# Patient Record
Sex: Female | Born: 1980 | Race: White | Hispanic: No | Marital: Single | Smoking: Former smoker
Health system: Southern US, Community
[De-identification: ages and names within clinical notes are randomized; demographics above are authoritative.]

## PROBLEM LIST (undated history)

## (undated) DIAGNOSIS — O139 Gestational [pregnancy-induced] hypertension without significant proteinuria, unspecified trimester: Secondary | ICD-10-CM

## (undated) DIAGNOSIS — E669 Obesity, unspecified: Secondary | ICD-10-CM

## (undated) DIAGNOSIS — O24419 Gestational diabetes mellitus in pregnancy, unspecified control: Secondary | ICD-10-CM

## (undated) HISTORY — DX: Gestational (pregnancy-induced) hypertension without significant proteinuria, unspecified trimester: O13.9

## (undated) HISTORY — PX: FOOT SURGERY: SHX648

## (undated) HISTORY — PX: TUBAL LIGATION: SHX77

## (undated) HISTORY — DX: Obesity, unspecified: E66.9

---

## 2011-01-07 ENCOUNTER — Ambulatory Visit: Payer: Self-pay | Admitting: Family Medicine

## 2011-06-22 ENCOUNTER — Emergency Department: Payer: Self-pay | Admitting: Emergency Medicine

## 2011-08-17 DIAGNOSIS — O24419 Gestational diabetes mellitus in pregnancy, unspecified control: Secondary | ICD-10-CM

## 2011-08-17 HISTORY — DX: Gestational diabetes mellitus in pregnancy, unspecified control: O24.419

## 2012-03-07 ENCOUNTER — Emergency Department: Payer: Self-pay | Admitting: Emergency Medicine

## 2012-03-07 LAB — COMPREHENSIVE METABOLIC PANEL
BUN: 9 mg/dL (ref 7–18)
Chloride: 105 mmol/L (ref 98–107)
Co2: 22 mmol/L (ref 21–32)
EGFR (African American): >60
EGFR (Non-African Amer.): >60
SGOT(AST): 16 U/L (ref 15–37)
SGPT (ALT): 27 U/L

## 2012-03-07 LAB — CBC
HCT: 35.7 % (ref 35.0–47.0)
HGB: 11.6 g/dL — ABNORMAL LOW (ref 12.0–16.0)
MCHC: 32.5 g/dL (ref 32.0–36.0)
MCV: 92 fL (ref 80–100)
RBC: 3.86 10*6/uL (ref 3.80–5.20)
RDW: 12.9 % (ref 11.5–14.5)
WBC: 11.5 10*3/uL — ABNORMAL HIGH (ref 3.6–11.0)

## 2012-03-07 LAB — HCG, QUANTITATIVE, PREGNANCY: Beta Hcg, Quant.: 10881 m[IU]/mL — ABNORMAL HIGH

## 2012-03-08 ENCOUNTER — Inpatient Hospital Stay: Payer: Self-pay | Admitting: Surgery

## 2012-03-08 LAB — URINALYSIS, COMPLETE
Blood: NEGATIVE
Glucose,UR: NEGATIVE mg/dL (ref 0–75)
Nitrite: NEGATIVE
Ph: 5 (ref 4.5–8.0)
Protein: NEGATIVE
Specific Gravity: 1.028 (ref 1.003–1.030)
WBC UR: 1 /HPF (ref 0–5)

## 2012-03-08 LAB — CBC WITH DIFFERENTIAL/PLATELET
Basophil %: 0.2 %
Eosinophil %: 0 %
HCT: 34.1 % — ABNORMAL LOW (ref 35.0–47.0)
HGB: 11 g/dL — ABNORMAL LOW (ref 12.0–16.0)
Lymphocyte %: 8.1 %
MCHC: 32.1 g/dL (ref 32.0–36.0)
Monocyte %: 3.5 %
Neutrophil #: 12.5 10*3/uL — ABNORMAL HIGH (ref 1.4–6.5)
Neutrophil %: 88.2 %
RBC: 3.69 10*6/uL — ABNORMAL LOW (ref 3.80–5.20)

## 2012-03-08 LAB — COMPREHENSIVE METABOLIC PANEL
Albumin: 3.1 g/dL — ABNORMAL LOW (ref 3.4–5.0)
Alkaline Phosphatase: 63 U/L (ref 50–136)
Anion Gap: 10 (ref 7–16)
BUN: 9 mg/dL (ref 7–18)
Chloride: 105 mmol/L (ref 98–107)
Glucose: 128 mg/dL — ABNORMAL HIGH (ref 65–99)
Potassium: 3.5 mmol/L (ref 3.5–5.1)
SGOT(AST): 21 U/L (ref 15–37)
SGPT (ALT): 27 U/L
Total Protein: 7.3 g/dL (ref 6.4–8.2)

## 2012-03-09 LAB — CBC WITH DIFFERENTIAL/PLATELET
Eosinophil %: 1.1 %
HCT: 33.3 % — ABNORMAL LOW (ref 35.0–47.0)
Lymphocyte %: 22.1 %
MCHC: 34.2 g/dL (ref 32.0–36.0)
Monocyte #: 0.6 x10 3/mm (ref 0.2–0.9)
Monocyte %: 6.1 %
Neutrophil #: 6.8 10*3/uL — ABNORMAL HIGH (ref 1.4–6.5)
Neutrophil %: 70.6 %
Platelet: 220 10*3/uL (ref 150–440)
RBC: 3.61 10*6/uL — ABNORMAL LOW (ref 3.80–5.20)
WBC: 9.6 10*3/uL (ref 3.6–11.0)

## 2012-03-09 LAB — COMPREHENSIVE METABOLIC PANEL
Albumin: 2.8 g/dL — ABNORMAL LOW (ref 3.4–5.0)
Alkaline Phosphatase: 66 U/L (ref 50–136)
BUN: 4 mg/dL — ABNORMAL LOW (ref 7–18)
Bilirubin,Total: 0.5 mg/dL (ref 0.2–1.0)
Chloride: 107 mmol/L (ref 98–107)
Creatinine: 0.56 mg/dL — ABNORMAL LOW (ref 0.60–1.30)
EGFR (African American): >60
EGFR (Non-African Amer.): >60
Glucose: 95 mg/dL (ref 65–99)
SGOT(AST): 16 U/L (ref 15–37)
SGPT (ALT): 25 U/L
Total Protein: 6.3 g/dL — ABNORMAL LOW (ref 6.4–8.2)

## 2012-03-10 LAB — CBC WITH DIFFERENTIAL/PLATELET
Basophil #: 0 10*3/uL (ref 0.0–0.1)
Basophil %: 0.3 %
Eosinophil #: 0.2 10*3/uL (ref 0.0–0.7)
Eosinophil %: 2.1 %
HCT: 32.6 % — ABNORMAL LOW (ref 35.0–47.0)
Lymphocyte %: 21.1 %
MCH: 32 pg (ref 26.0–34.0)
MCHC: 35.1 g/dL (ref 32.0–36.0)
Neutrophil #: 6.3 10*3/uL (ref 1.4–6.5)
Neutrophil %: 69.1 %
RDW: 13 % (ref 11.5–14.5)
WBC: 9.1 10*3/uL (ref 3.6–11.0)

## 2012-03-10 LAB — COMPREHENSIVE METABOLIC PANEL
Anion Gap: 8 (ref 7–16)
BUN: 3 mg/dL — ABNORMAL LOW (ref 7–18)
Bilirubin,Total: 0.5 mg/dL (ref 0.2–1.0)
Chloride: 106 mmol/L (ref 98–107)
Co2: 23 mmol/L (ref 21–32)
Creatinine: 0.48 mg/dL — ABNORMAL LOW (ref 0.60–1.30)
EGFR (African American): >60
EGFR (Non-African Amer.): >60
Glucose: 90 mg/dL (ref 65–99)
Osmolality: 270 (ref 275–301)
Potassium: 3.2 mmol/L — ABNORMAL LOW (ref 3.5–5.1)
SGOT(AST): 14 U/L — ABNORMAL LOW (ref 15–37)
Sodium: 137 mmol/L (ref 136–145)

## 2012-07-03 ENCOUNTER — Ambulatory Visit: Payer: Self-pay

## 2012-07-07 ENCOUNTER — Observation Stay: Payer: Self-pay | Admitting: Obstetrics and Gynecology

## 2012-07-07 LAB — PIH PROFILE
Calcium, Total: 8.9 mg/dL (ref 8.5–10.1)
Co2: 26 mmol/L (ref 21–32)
EGFR (African American): >60
EGFR (Non-African Amer.): >60
Glucose: 103 mg/dL — ABNORMAL HIGH (ref 65–99)
HCT: 29.9 % — ABNORMAL LOW (ref 35.0–47.0)
MCHC: 34.2 g/dL (ref 32.0–36.0)
MCV: 87 fL (ref 80–100)
Osmolality: 276 (ref 275–301)
Platelet: 228 10*3/uL (ref 150–440)
Potassium: 3.6 mmol/L (ref 3.5–5.1)
RBC: 3.44 10*6/uL — ABNORMAL LOW (ref 3.80–5.20)
RDW: 13.8 % (ref 11.5–14.5)
SGOT(AST): 14 U/L — ABNORMAL LOW (ref 15–37)
Sodium: 139 mmol/L (ref 136–145)
Uric Acid: 3.4 mg/dL (ref 2.6–6.0)

## 2012-07-07 LAB — PROTEIN / CREATININE RATIO, URINE
Creatinine, Urine: 43.6 mg/dL (ref 30.0–125.0)
Protein/Creat. Ratio: 298 mg/gCREAT — ABNORMAL HIGH (ref 0–200)

## 2012-07-09 LAB — PROTEIN, URINE, 24 HOUR
Collection Hours: 24 hours
Protein, Urine: 10 mg/dL (ref 0–12)
Total Volume: 2500 mL

## 2012-07-16 ENCOUNTER — Ambulatory Visit: Payer: Self-pay

## 2012-08-04 ENCOUNTER — Observation Stay: Payer: Self-pay | Admitting: Obstetrics and Gynecology

## 2012-08-04 LAB — PIH PROFILE
Anion Gap: 9 (ref 7–16)
BUN: 7 mg/dL (ref 7–18)
Chloride: 107 mmol/L (ref 98–107)
Creatinine: 0.51 mg/dL — ABNORMAL LOW (ref 0.60–1.30)
EGFR (African American): >60
HCT: 31 % — ABNORMAL LOW (ref 35.0–47.0)
HGB: 10.6 g/dL — ABNORMAL LOW (ref 12.0–16.0)
MCH: 29 pg (ref 26.0–34.0)
MCHC: 34.2 g/dL (ref 32.0–36.0)
Osmolality: 274 (ref 275–301)
Platelet: 230 10*3/uL (ref 150–440)
RDW: 14.1 % (ref 11.5–14.5)
Sodium: 138 mmol/L (ref 136–145)
Uric Acid: 4.4 mg/dL (ref 2.6–6.0)

## 2012-08-04 LAB — PROTEIN / CREATININE RATIO, URINE
Protein, Random Urine: 8 mg/dL (ref 0–12)
Protein/Creat. Ratio: 151 mg/gCREAT (ref 0–200)

## 2012-08-07 ENCOUNTER — Inpatient Hospital Stay: Payer: Self-pay

## 2012-08-07 LAB — CBC WITH DIFFERENTIAL/PLATELET
Basophil #: 0 10*3/uL (ref 0.0–0.1)
Basophil %: 0.4 %
Eosinophil %: 1.2 %
HCT: 31 % — ABNORMAL LOW (ref 35.0–47.0)
HGB: 10.3 g/dL — ABNORMAL LOW (ref 12.0–16.0)
Lymphocyte #: 1.6 10*3/uL (ref 1.0–3.6)
Lymphocyte %: 21.2 %
MCH: 28.2 pg (ref 26.0–34.0)
MCV: 85 fL (ref 80–100)
Monocyte #: 0.4 x10 3/mm (ref 0.2–0.9)
Monocyte %: 5.6 %
Neutrophil #: 5.5 10*3/uL (ref 1.4–6.5)
RBC: 3.64 10*6/uL — ABNORMAL LOW (ref 3.80–5.20)
RDW: 13.9 % (ref 11.5–14.5)
WBC: 7.7 10*3/uL (ref 3.6–11.0)

## 2012-08-07 LAB — PROTEIN / CREATININE RATIO, URINE: Protein/Creat. Ratio: 1145 mg/gCREAT — ABNORMAL HIGH (ref 0–200)

## 2012-08-08 DIAGNOSIS — O24419 Gestational diabetes mellitus in pregnancy, unspecified control: Secondary | ICD-10-CM

## 2012-08-08 DIAGNOSIS — O139 Gestational [pregnancy-induced] hypertension without significant proteinuria, unspecified trimester: Secondary | ICD-10-CM

## 2012-08-09 LAB — HEMATOCRIT: HCT: 27.6 % — ABNORMAL LOW (ref 35.0–47.0)

## 2012-09-21 ENCOUNTER — Ambulatory Visit: Payer: Self-pay | Admitting: Obstetrics and Gynecology

## 2012-10-13 ENCOUNTER — Ambulatory Visit: Payer: Self-pay | Admitting: Obstetrics and Gynecology

## 2013-01-02 IMAGING — US US OB < 14 WEEKS - US OB TV
1 series · 17 of 28 positions shown · non-contrast
Comparison: none

REASON FOR EXAM: pregnant - 6 weeks per pt - bleeding - eval for
IUP/ectopic
COMMENTS:

PROCEDURE:     US  - US OB LESS THAN 14 WEEKS/W TRANS  - June 22, 2011  [DATE]
RESULT:     History: Pregnant and bleeding.
Comparison Study: No prior.

[Series 1: us ob < 14 weeks - us ob tv · 119 acquisitions, 17 frames shown]
[im 1/119]
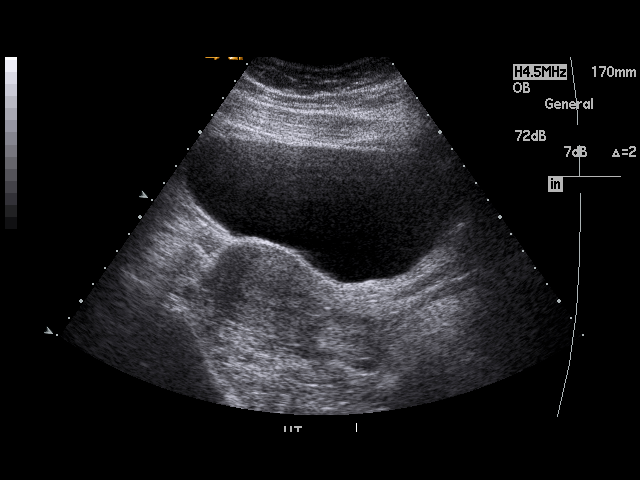
[im 9/119]
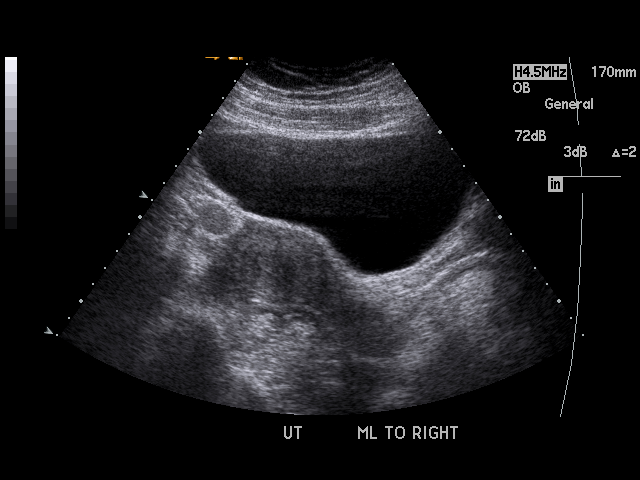
[im 18/119]
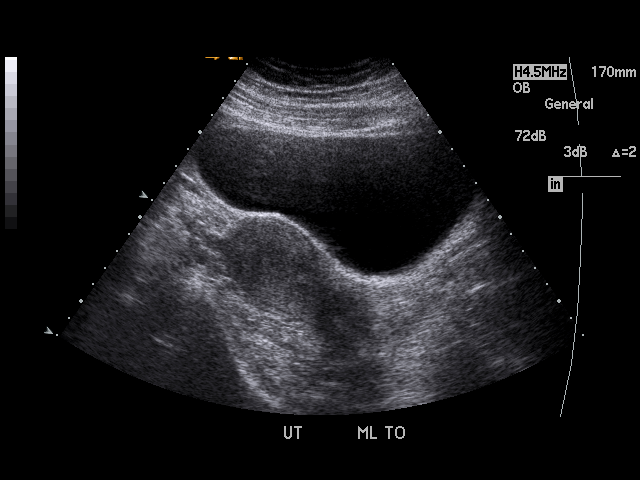
[im 22/119]
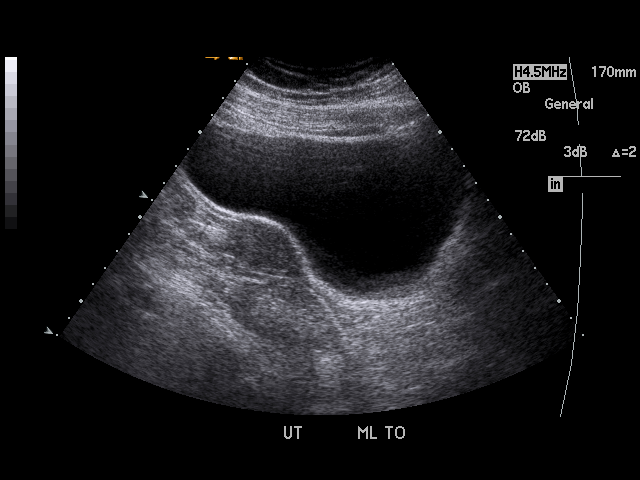
[im 31/119]
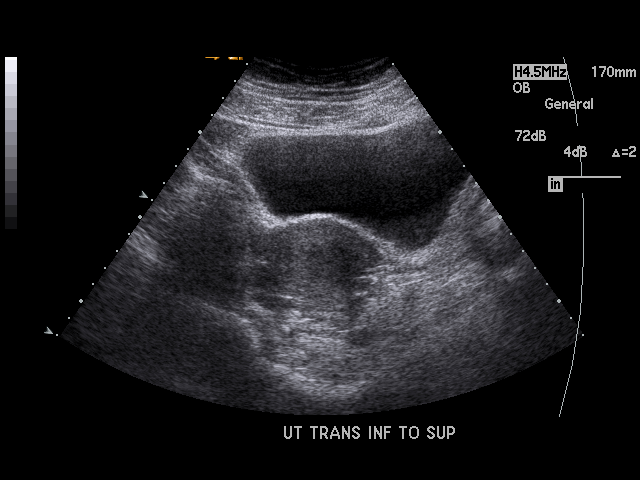
[im 40/119]
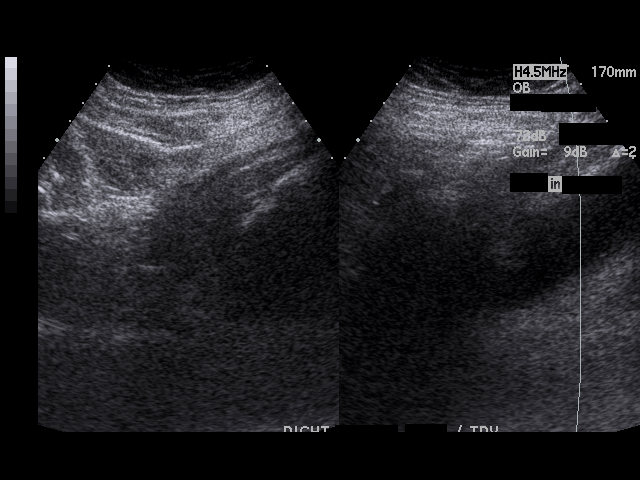
[im 44/119]
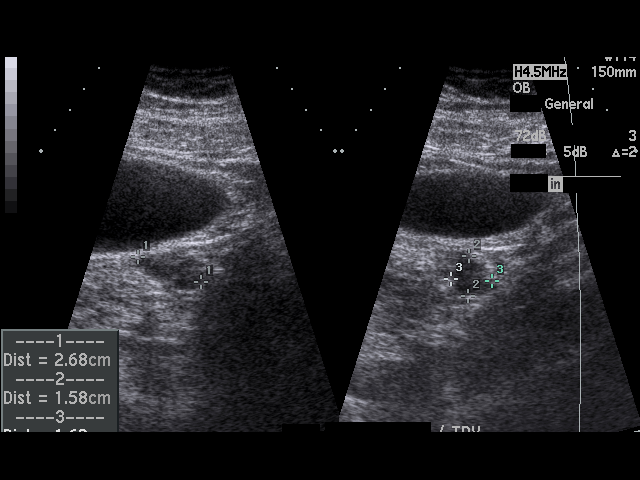
[im 53/119]
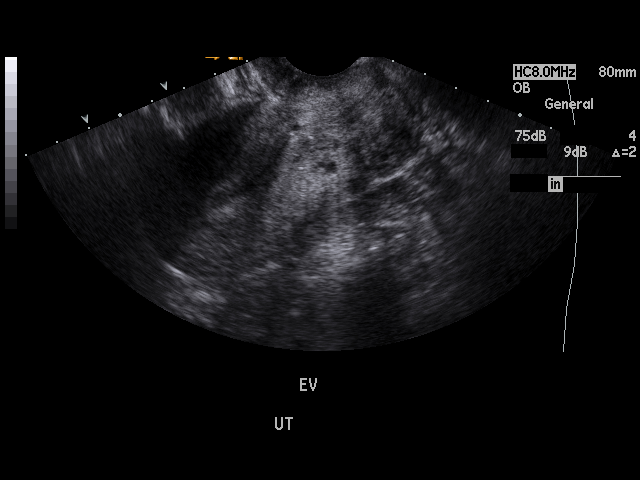
[im 62/119]
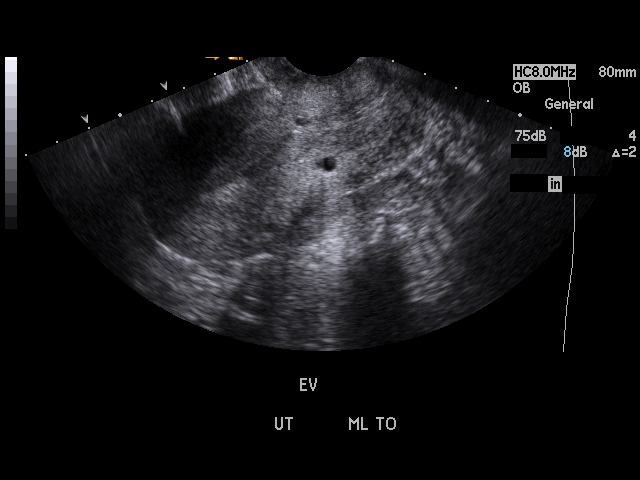
[im 66/119]
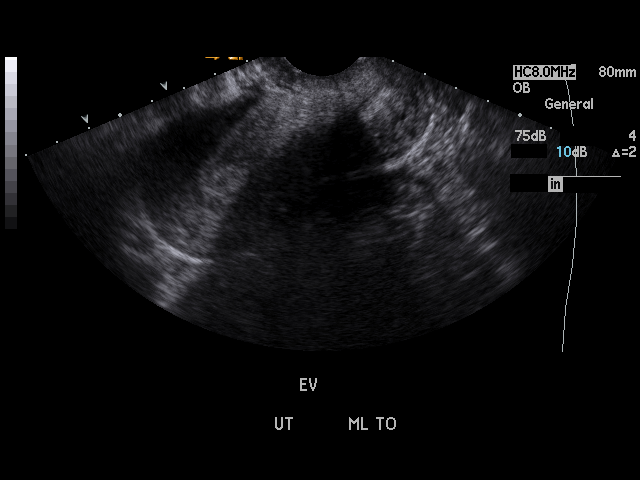
[im 75/119]
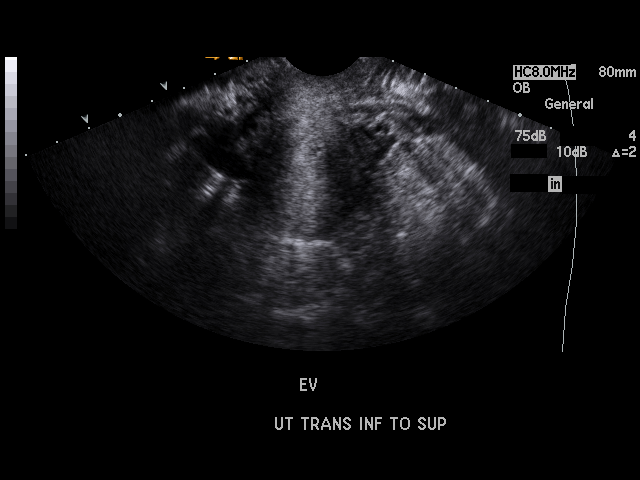
[im 79/119]
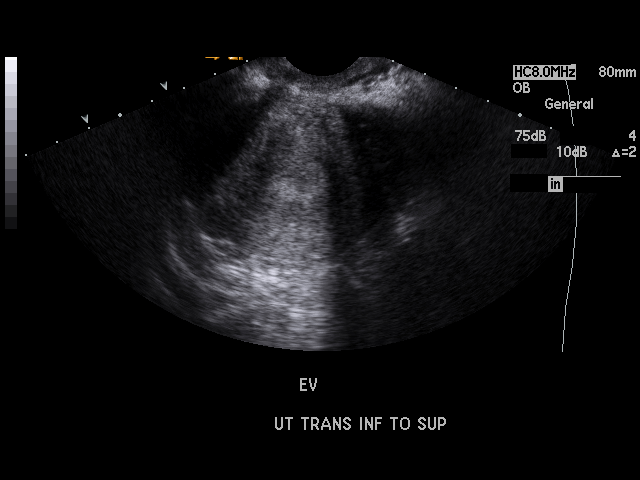
[im 88/119]
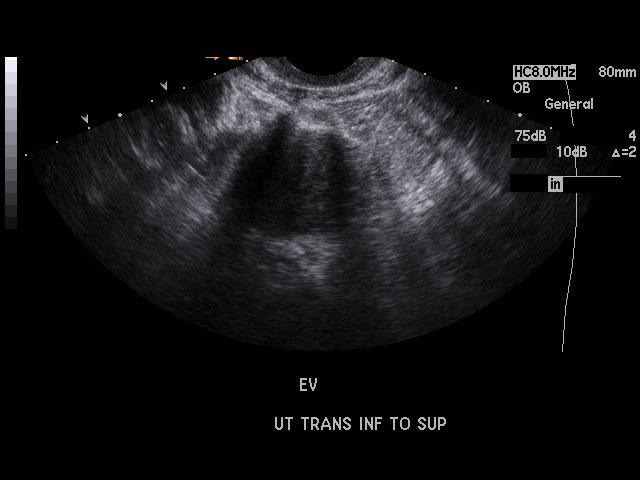
[im 97/119]
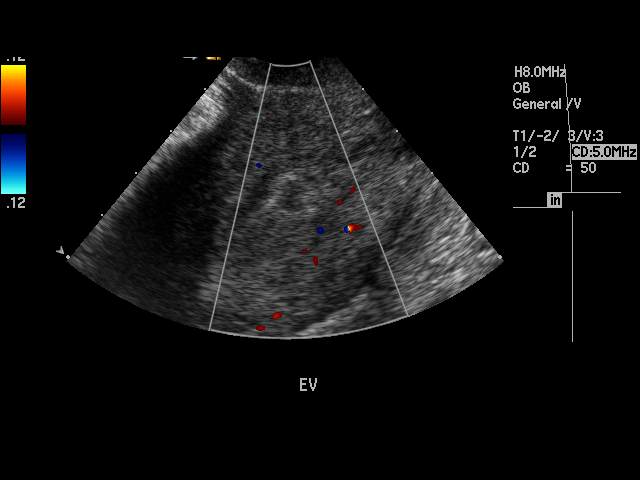
[im 101/119]
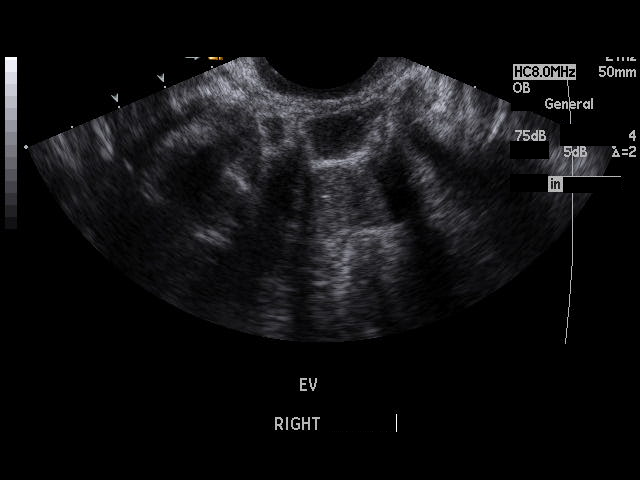
[im 110/119]
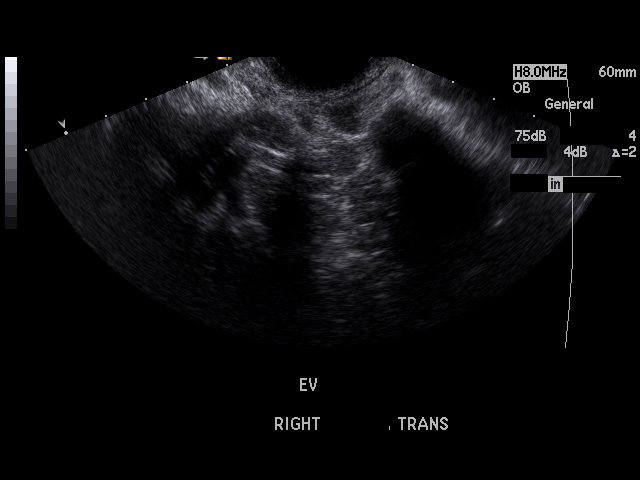
[im 119/119]
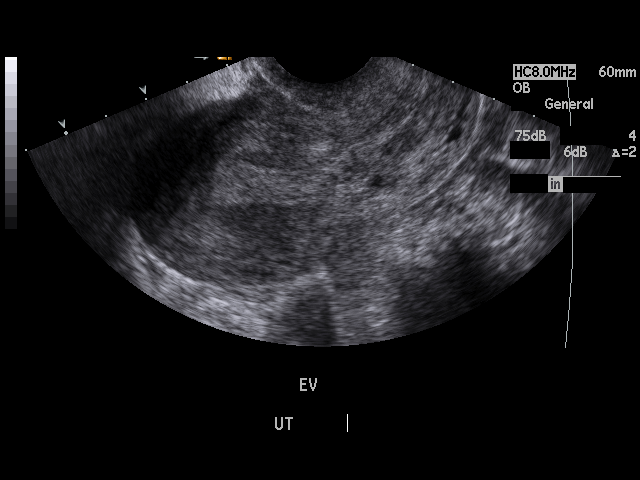

[17 of 28 positions shown; findings below may reference images not displayed]

FINDINGS: No intrauterine pregnancy noted. Trace amount of fluid noted in
endometrial cavity. Uterus is unremarkable. Right ovary is not identified.
Left ovary is normal. No pathologic free fluid noted. Follow up pelvic
ultrasound and pregnancy test should be considered.
IMPRESSION: No evidence of intrauterine pregnancy. Trace amount of
endometrial cavity fluid noted as described above.

## 2013-03-27 ENCOUNTER — Emergency Department: Payer: Self-pay | Admitting: Unknown Physician Specialty

## 2013-03-27 LAB — CBC
HCT: 40.8 % (ref 35.0–47.0)
HGB: 14.2 g/dL (ref 12.0–16.0)
MCH: 30.6 pg (ref 26.0–34.0)
MCHC: 34.8 g/dL (ref 32.0–36.0)
MCV: 88 fL (ref 80–100)
Platelet: 215 10*3/uL (ref 150–440)
RBC: 4.64 10*6/uL (ref 3.80–5.20)
RDW: 14.7 % — ABNORMAL HIGH (ref 11.5–14.5)

## 2013-03-27 LAB — BASIC METABOLIC PANEL
Anion Gap: 7 (ref 7–16)
Calcium, Total: 9.2 mg/dL (ref 8.5–10.1)
Co2: 24 mmol/L (ref 21–32)
EGFR (African American): >60
EGFR (Non-African Amer.): >60
Glucose: 110 mg/dL — ABNORMAL HIGH (ref 65–99)
Osmolality: 272 (ref 275–301)
Potassium: 4 mmol/L (ref 3.5–5.1)
Sodium: 135 mmol/L — ABNORMAL LOW (ref 136–145)

## 2013-09-19 IMAGING — US ABDOMEN ULTRASOUND LIMITED
1 series · 14 of 25 positions shown · non-contrast
Comparison: none

REASON FOR EXAM: RUQ pain
COMMENTS:   Body Site: GB and Fossa, CBD, Head of Pancreas

PROCEDURE:     US  - US ABDOMEN LIMITED SURVEY  - March 08, 2012  [DATE]
RESULT:     Comparison: None
TECHNIQUE: Multiple gray-scale and color-flow Doppler images of the right
upper quadrant are presented for review.

[Series 1: abdomen ultrasound limited · 0.31mm/px · 14 of 38 slices shown]
[im 1/38]
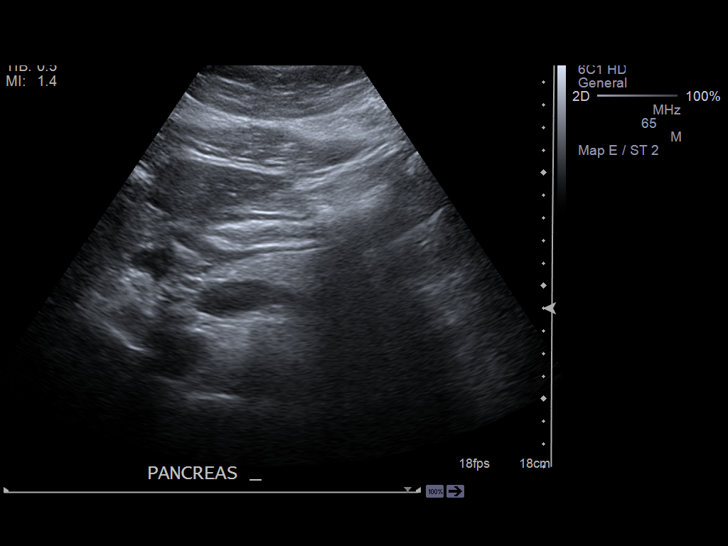
[im 4/38]
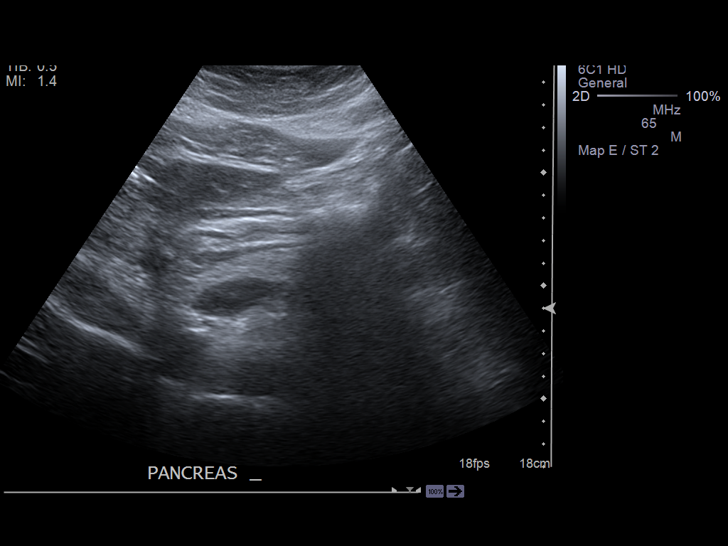
[im 7/38]
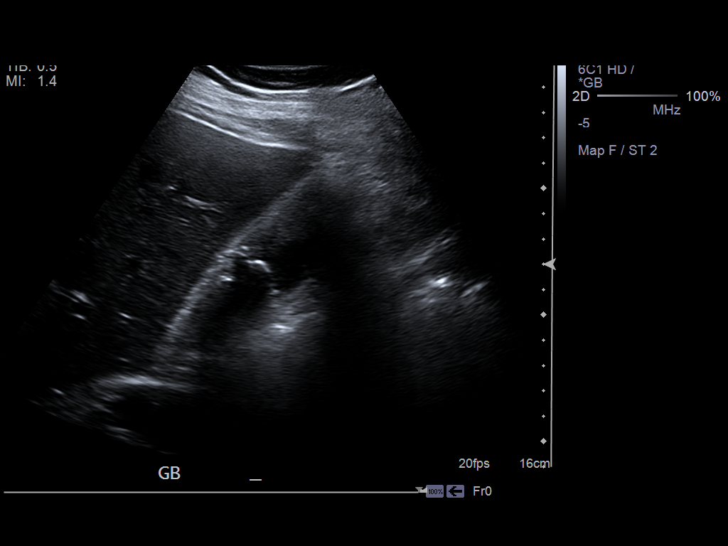
[im 10/38]
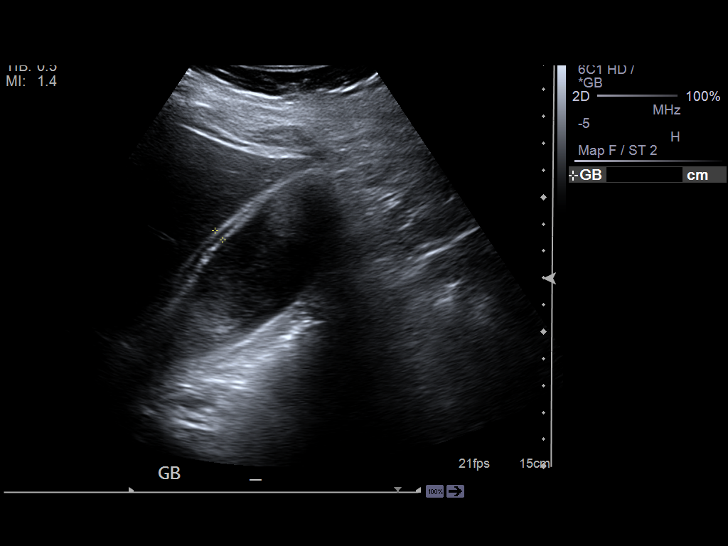
[im 13/38]
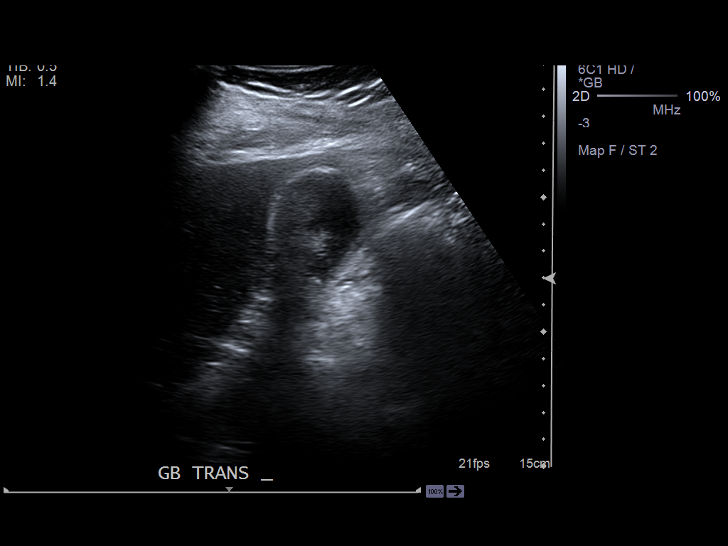
[im 14/38]
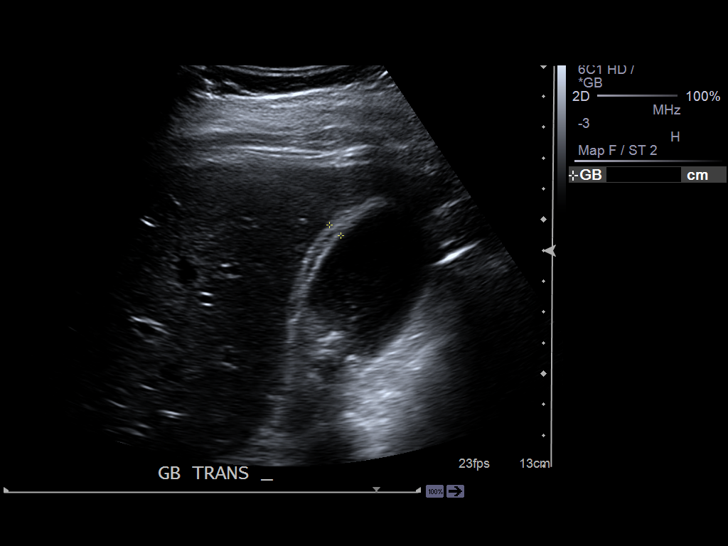
[im 17/38]
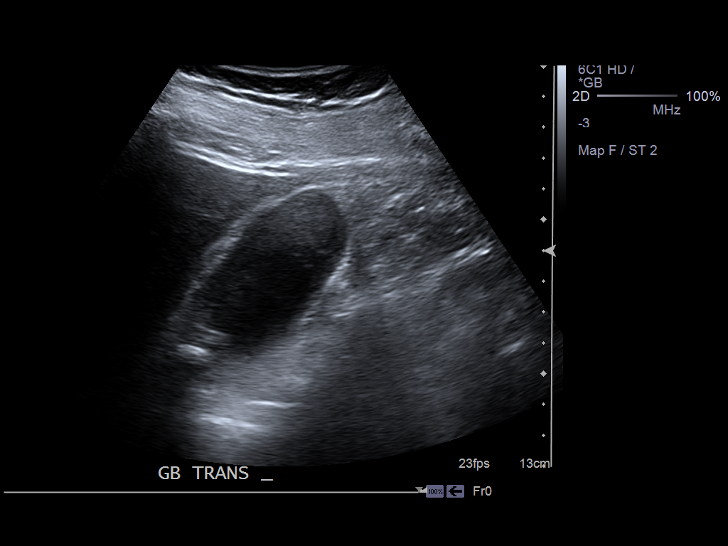
[im 21/38]
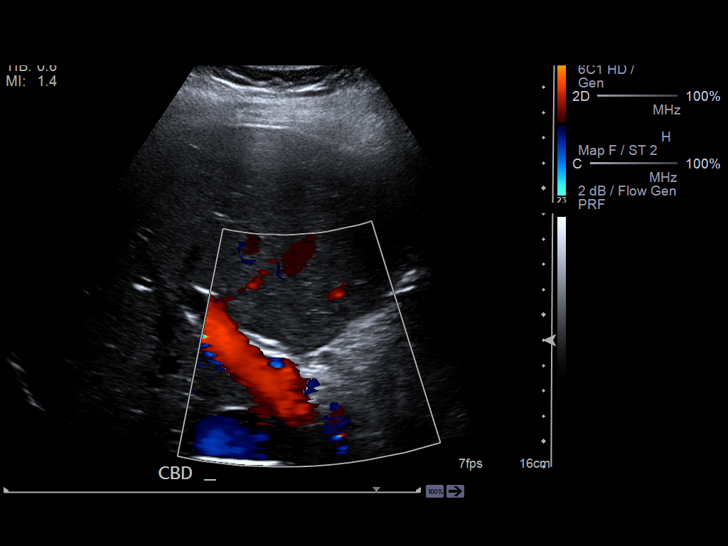
[im 24/38]
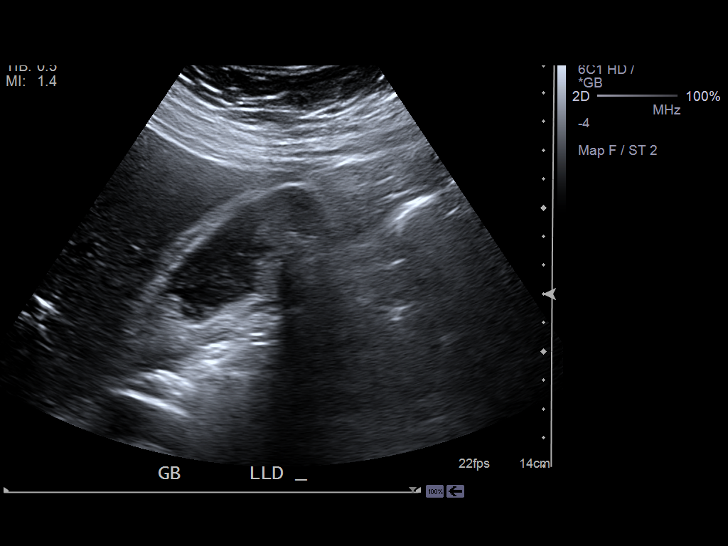
[im 25/38]
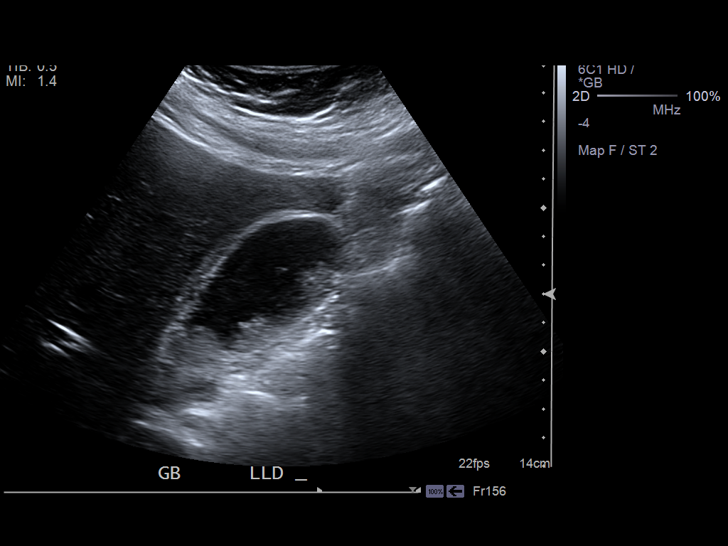
[im 28/38]
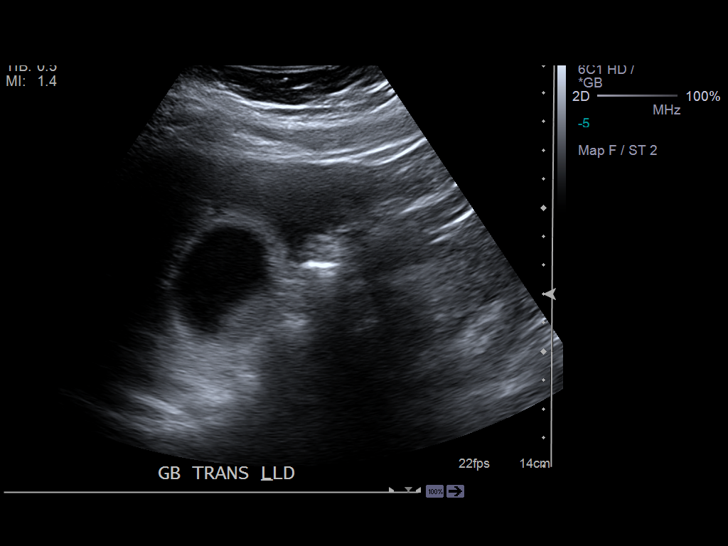
[im 31/38]
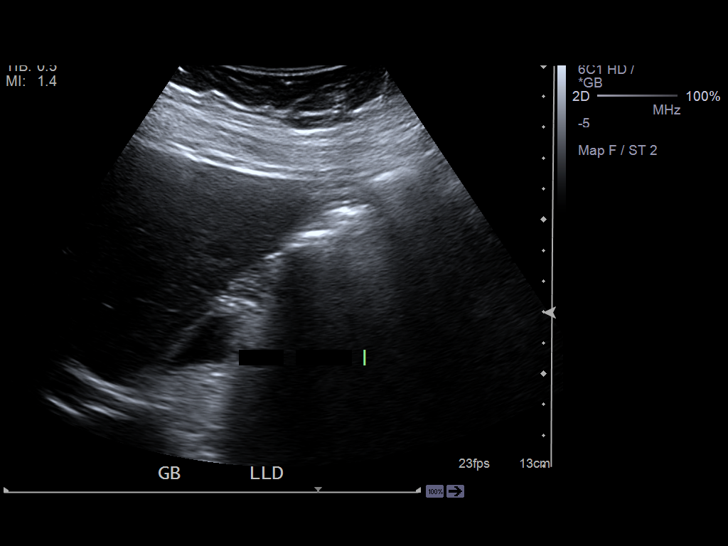
[im 34/38]
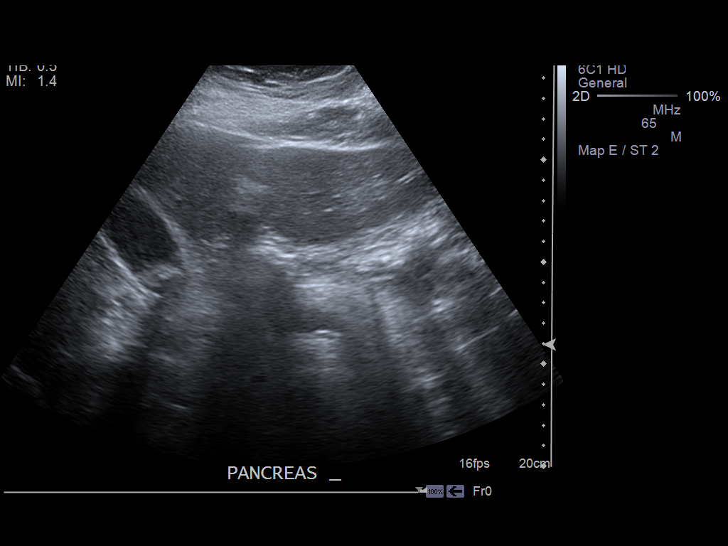
[im 38/38]
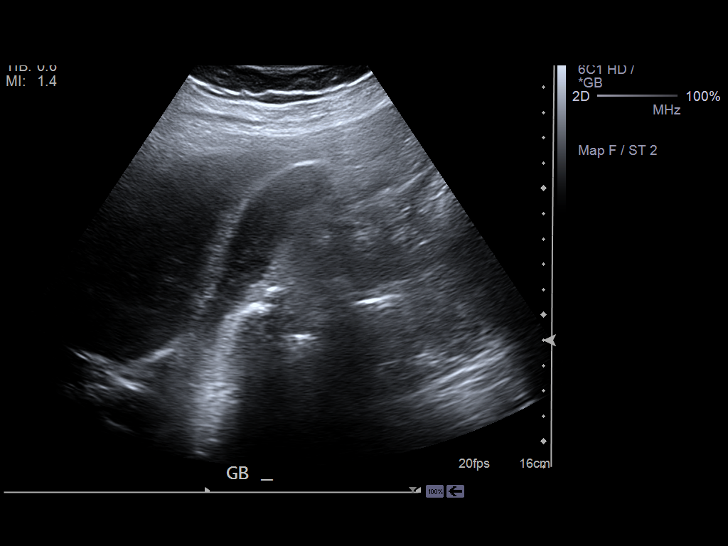

[14 of 25 positions shown; findings below may reference images not displayed]

FINDINGS: Visualized portions of the liver demonstrate normal echogenicity and normal
contours. The liver is without evidence of a focal hepatic lesion.

There are cholelithiasis. There is no intra- or extrahepatic biliary ductal
dilatation. The common duct measures 2.7 mm in maximal diameter. There is
gallbladder wall thickening measuring up to 5.1 mm. There is a trace amount
of pericholecystic fluid.

The visualized portion of the pancreas is normal in echogenicity.
IMPRESSION: Cholelithiasis with gallbladder wall thickening and pericholecystic fluid
concerning for acute cholecystitis. Surgical consultation recommended.

[REDACTED]

## 2014-10-08 IMAGING — CR DG CHEST 2V
1 series · 2 of 2 positions shown · non-contrast
Comparison: none

REASON FOR EXAM: cough fever
COMMENTS:

PROCEDURE:     DXR - DXR CHEST PA (OR AP) AND LATERAL  - March 27, 2013  [DATE]
RESULT:     Comparison: None

[Series 1: w chest pa · 0.14mm/px · 2 of 2 slices shown]
[im 1/2]
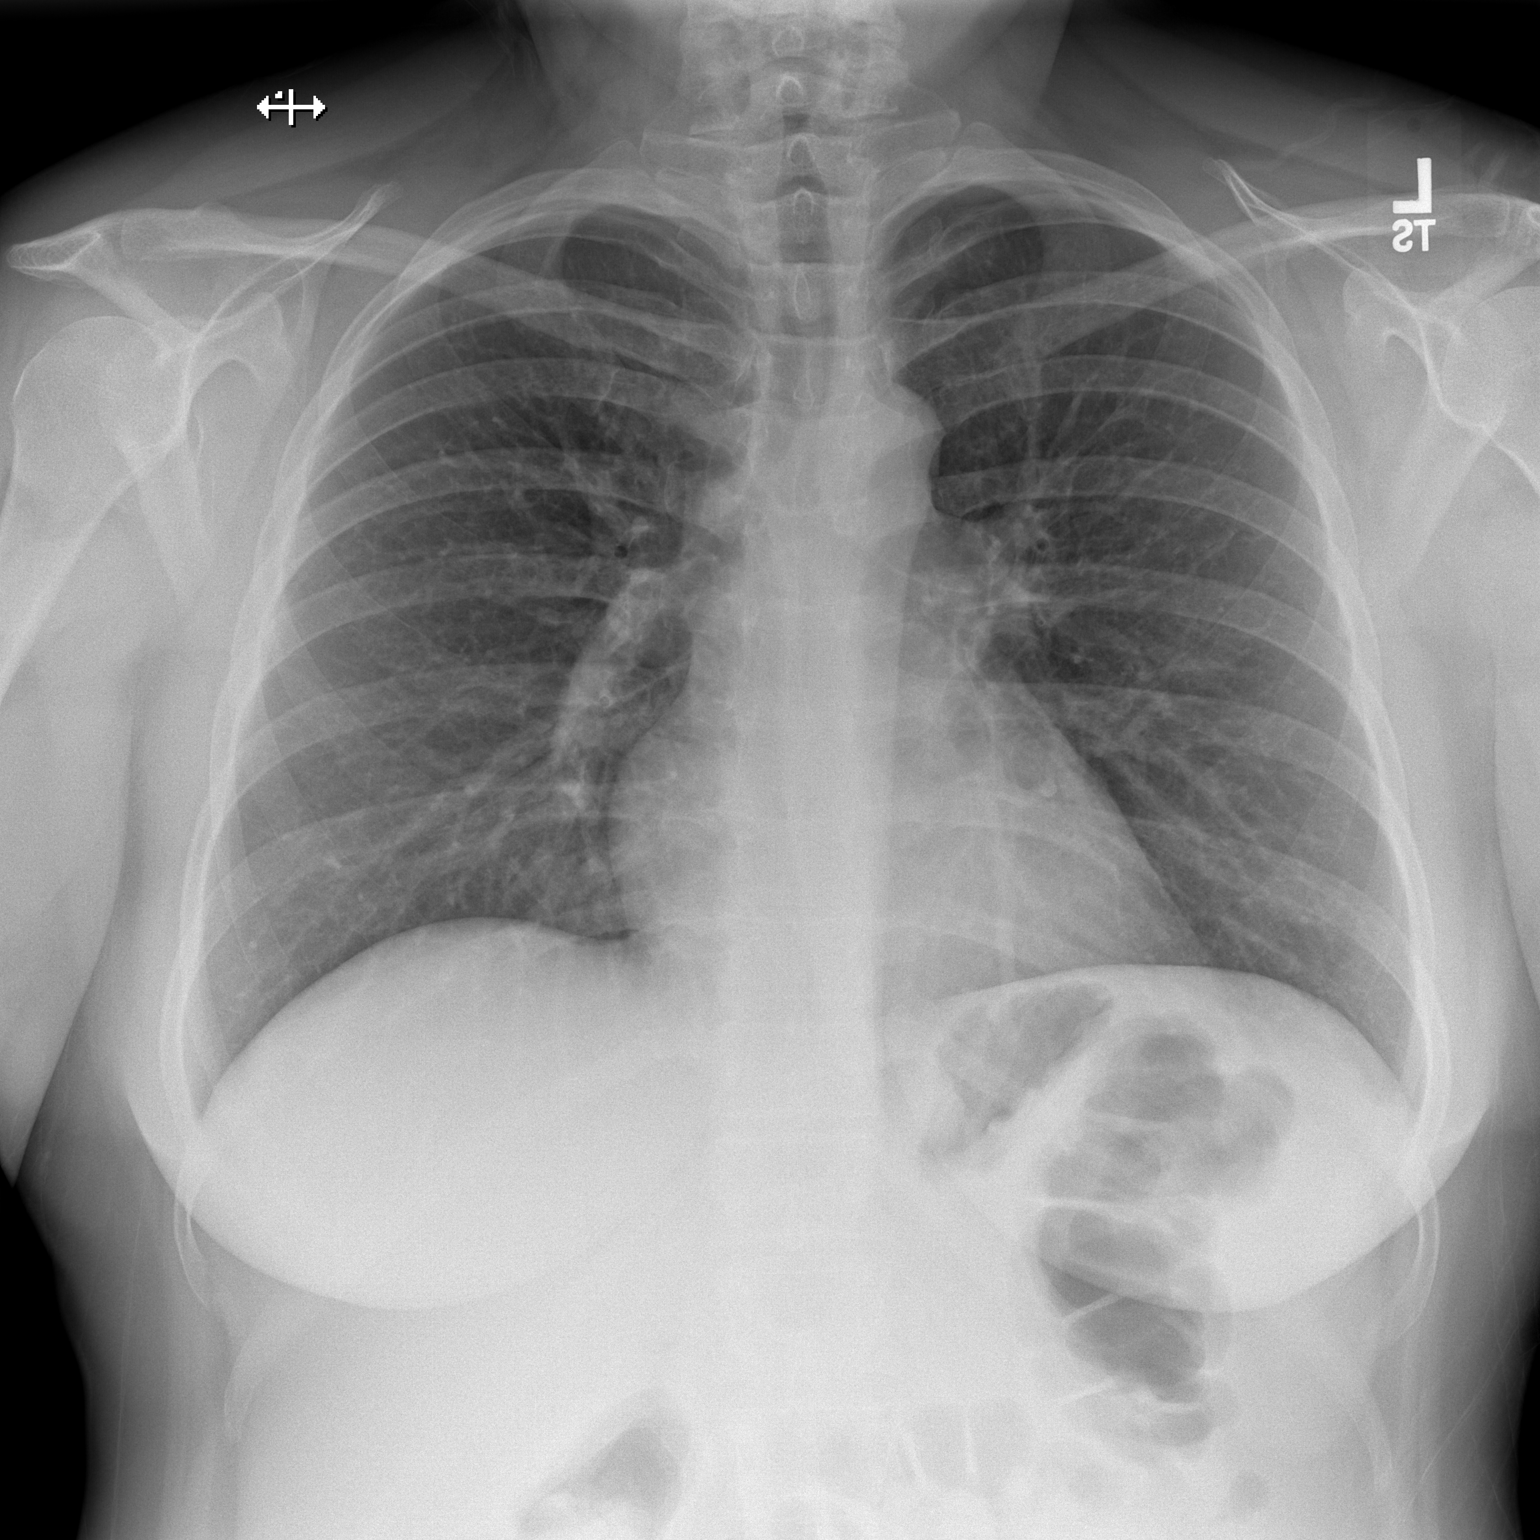
[im 2/2]
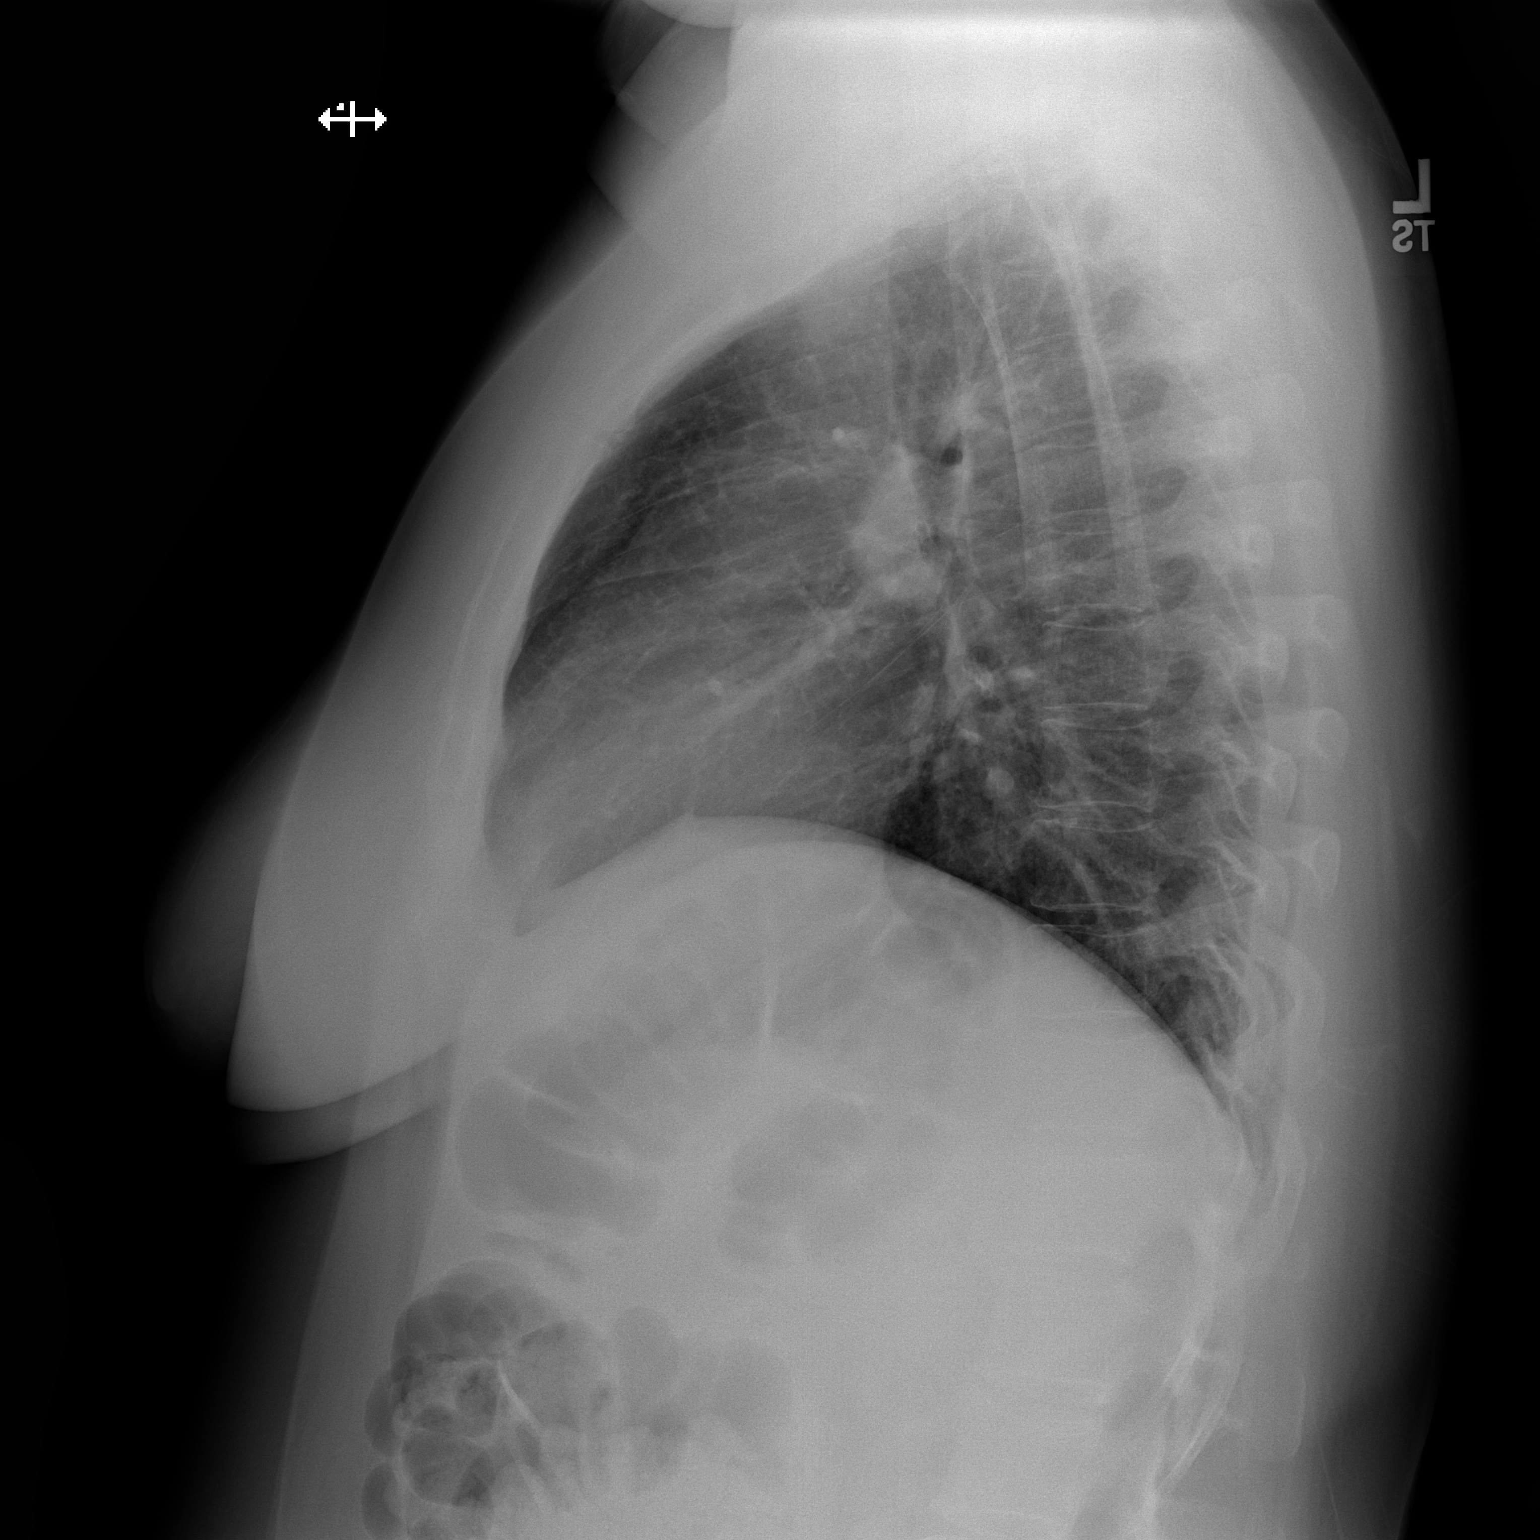

[2 of 2 positions shown; findings below may reference images not displayed]

FINDINGS: PA and lateral chest radiographs are provided.  There is no focal
parenchymal opacity, pleural effusion, or pneumothorax. The heart and
mediastinum are unremarkable.  The osseous structures are unremarkable.
IMPRESSION: No acute disease of the che[REDACTED]

## 2014-12-03 NOTE — H&P (Signed)
PATIENT NAME:  Diane Boyer, Diane Boyer MR#:  [id number] DATE OF BIRTH:  1980-10-12  DATE OF ADMISSION:  03/08/2012  CHIEF COMPLAINT: Right upper quadrant pain.   HISTORY OF PRESENT ILLNESS: This is a patient who is [redacted] weeks pregnant with her third pregnancy. She describes first and only episode of right upper quadrant pain started yesterday. She went to the ER last night but was in such discomfort she went home without being seen. She returns this morning because it was less busy. She has had nausea, vomiting, multiple emeses. No fevers or chills. No jaundice or acholic stools. She has never had an episode like this before. She has some back pain associated with it. I was asked to see the patient for findings of acute cholecystitis on ultrasound.   PAST MEDICAL HISTORY: None.   PAST SURGICAL HISTORY: Foot surgery.   GYN HISTORY: G3, P1, AB1 17 weeks current IUP.   ALLERGIES: Amoxicillin and penicillin.   MEDICATIONS: Prenatal vitamins.   FAMILY HISTORY: Noncontributory.   SOCIAL HISTORY: Patient works as a Psychiatristwaitress part-time. Continues to smoke during pregnancy in spite [facility] (I discussed this with her). She was a heavy drinker prior to pregnancy, is not drinking at this time.   REVIEW OF SYSTEMS: 10 system review is performed and negative with the exception of that mentioned in the history of present illness.   PHYSICAL EXAMINATION:  GENERAL: Healthy, comfortable-appearing Caucasian female patient, BMI 36, 250 pounds, 70 inches, temperature 97.9, pulse 88, respirations 18, blood pressure 102/71, 97% room air sat. Pain scale 2.   HEENT: No scleral icterus.   NECK: No palpable neck nodes.   CHEST: Chest showing right-sided rhonchi, fairly clear on the left side.   CARDIAC: Regular rate and rhythm.   ABDOMEN: Soft. There is minimal tenderness in right upper quadrant with a negative Murphy's sign. No scars are noted.   EXTREMITIES: Without edema.  Calves are nontender.   NEUROLOGIC: Grossly intact.   INTEGUMENT: No jaundice.   LABORATORY, DIAGNOSTIC AND RADIOLOGICAL DATA: An ultrasound shows gallstones with thickened gallbladder wall, normal common bile duct. White blood cell count 14.2, hemoglobin and hematocrit 11 and 34, platelet count 231. Electrolytes are within normal limits. Liver function tests are within normal limits. Albumin 3.1.   ASSESSMENT AND PLAN: This is a patient with possible early acute cholecystitis in a pregnant patient [redacted] weeks pregnant. My recommendations are to admit this patient to the hospital, start IV antibiotics, hydrate and control her pain and nausea. If she does not improve rapidly then laparoscopic cholecystectomy during the second trimester could be performed, however, there is a small risk of preterm labor in this patient and this was discussed with she and her mother. The rationale for taking this conservative approach was discussed and the potential for surgery and its surgical implications were discussed as well. They understood and agreed with this plan. I discussed this patient with Dr. Clemens Catholicagsdale, the Emergency Room physician.  ____________________________ Adah Salvageichard E. Excell Seltzerooper, MD rec:cms D: 03/08/2012 10:30:18 ET T: 03/08/2012 11:20:55 ET JOB#: [id number] cc: Adah Salvageichard E. Excell Seltzerooper, MD, <Dictator> Lattie HawICHARD E Stacia Feazell MD ELECTRONICALLY SIGNED 03/08/2012 18:46

## 2014-12-03 NOTE — Discharge Summary (Signed)
PATIENT NAME:  Diane Boyer, [name] MR#:  [id number] DATE OF BIRTH:  05-24-1981  DATE OF ADMISSION:  03/08/2012 DATE OF DISCHARGE:  03/10/2012  DIAGNOSES:  1. Pregnancy. 2. Tobacco abuse. 3. Biliary colic. 4. Possible acute cholecystitis.   PROCEDURES: None.   CONSULTANTS: None.   HISTORY OF PRESENT ILLNESS/HOSPITAL COURSE: This is a patient who is [redacted] weeks pregnant presents with abdominal pain consistent with biliary colic. She had a slightly elevated white blood cell count and physical findings of right upper quadrant pain and tenderness. This was her first and only episode of right upper quadrant pain. She was admitted to the hospital and started on IV antibiotics and was treated for two days with resolution of her pain and resolution of her nausea. She is discharged in stable condition to follow up in my office in 10 days. She was tolerating a regular diet. Note that she was discharged after I had left the service and complete details of her last day of visit is not reflected here.  ____________________________ Adah Salvageichard E. Excell Seltzerooper, MD rec:cms D: 03/17/2012 20:50:47 ET T: 03/18/2012 14:02:39 ET JOB#: [id number]  cc: Adah Salvageichard E. Excell Seltzerooper, MD, <Dictator>  Lattie HawICHARD E Dhana Totton MD ELECTRONICALLY SIGNED 03/18/2012 19:37

## 2014-12-03 NOTE — H&P (Signed)
Subjective/Chief Complaint RUQ pain    History of Present Illness pt with one day RUQ pain, nausea no f/c no prior episode no jaundice [redacted] weeks pregnant continues to smoke while pregnant    Past History PMH none PSH foot [id number]    Past Medical Health Smoking, pregnant   Past Med/Surgical Hx:  denies:   ALLERGIES:  Penicillin: Unknown  Amoxicillin: Unknown  Family and Social History:   Family History Non-Contributory    Social History positive  tobacco, positive ETOH, not drinking whlie pregnant, was heavy EtOH, still smoking    + Tobacco Current (within 1 year)    Place of Living Home   Review of Systems:   Fever/Chills No    Cough No    Abdominal Pain Yes    Diarrhea No    Constipation No    Nausea/Vomiting Yes    SOB/DOE No    Chest Pain No    Dysuria No    Tolerating Diet No  Nauseated  Vomiting   Physical Exam:   GEN no acute distress, comfortable    HEENT pink conjunctivae    NECK supple    RESP normal resp effort  no use of accessory muscles  rhonchi    CARD regular rate    ABD positive tenderness  soft  min tenderness RUQ    EXTR negative cyanosis/clubbing, negative edema    SKIN normal to palpation    PSYCH alert, A+O to time, place, person, good insight   Lab Results: Hepatic:  24-Jul-13 08:34    Bilirubin, Total 0.4   Alkaline Phosphatase 63   SGPT (ALT) 27 (12-78 NOTE: NEW REFERENCE RANGE 07/09/2011)   SGOT (AST) 21   Total Protein, Serum 7.3   Albumin, Serum  3.1  Routine Chem:  24-Jul-13 08:34    Glucose, Serum  128   BUN 9   Creatinine (comp)  0.41   Sodium, Serum 137   Potassium, Serum 3.5   Chloride, Serum 105   CO2, Serum 22   Calcium (Total), Serum 8.6   Osmolality (calc) 274   eGFR (African American) >60   eGFR (Non-African American) >60 (eGFR values <67m/min/1.73 m2 may be an indication of chronic kidney disease (CKD). Calculated eGFR is useful in patients with stable renal function. The eGFR  calculation will not be reliable in acutely ill patients when serum creatinine is changing rapidly. It is not useful in  patients on dialysis. The eGFR calculation may not be applicable to patients at the low and high extremes of body sizes, pregnant women, and vegetarians.)   Anion Gap 10  Routine UA:  23-Jul-13 22:04    Color (UA) Yellow   Clarity (UA) Turbid   Glucose (UA) Negative   Bilirubin (UA) Negative   Ketones (UA) 1+   Specific Gravity (UA) 1.028   Blood (UA) Negative   pH (UA) 5.0   Protein (UA) Negative   Nitrite (UA) Negative   Leukocyte Esterase (UA) Negative (Result(s) reported on 08 Mar 2012 at 07:29AM.)   RBC (UA) 1 /HPF   WBC (UA) 1 /HPF   Bacteria (UA) 1+   Epithelial Cells (UA) 29 /HPF   Mucous (UA) PRESENT   Calcium Oxalate Crystal (UA) PRESENT (Result(s) reported on 08 Mar 2012 at 07:29AM.)  Routine Hem:  24-Jul-13 08:34    WBC (CBC)  14.2   RBC (CBC)  3.69   Hemoglobin (CBC)  11.0   Hematocrit (CBC)  34.1   Platelet Count (CBC) 231  MCV 93   MCH 29.7   MCHC 32.1   RDW 12.7   Neutrophil % 88.2   Lymphocyte % 8.1   Monocyte % 3.5   Eosinophil % 0.0   Basophil % 0.2   Neutrophil #  12.5   Lymphocyte # 1.2   Monocyte # 0.5   Eosinophil # 0.0   Basophil # 0.0 (Result(s) reported on 08 Mar 2012 at 09:03AM.)   Radiology Results: [place]:    24-Jul-13 09:41, US Abdomen Limited Survey   US Abdomen Limited Survey   REASON FOR EXAM:    RUQ pain  COMMENTS:   Body Site: GB and Fossa, CBD, Head of Pancreas    PROCEDURE: [place]  - US ABDOMEN LIMITED SURVEY  - Mar 08 2012  9:41AM     RESULT: Comparison: None    Technique: Multiple gray-scale and color-flow Doppler imagesof the right   upper quadrant are presented for review.    Findings:    Visualized portions of the liver demonstrate normal echogenicity and   normal contours. The liver is without evidence of a focal hepatic lesion.     There are cholelithiasis. There is no intra- or extrahepatic  biliary   ductal dilatation. The common duct measures 2.7 mm in maximal diameter.   There is gallbladder wall thickening measuring up to 5.1 mm. There is a   trace amount of pericholecystic fluid.    The visualized portion of the pancreas is normal in echogenicity.    IMPRESSION:     Cholelithiasis with gallbladder wall thickening and pericholecystic fluid   concerning for acute cholecystitis. Surgical consultation recommended.    Dictation Site: 1        Verified By: Jennette Banker, M.D., MD     Assessment/Admission Diagnosis acute chole while pregnant admit hydrate, IV abx control nausea may need surgery if pain does not improve   Electronic Signatures: Florene Glen (MD)  (Signed 24-Jul-13 10:26)  Authored: CHIEF COMPLAINT and HISTORY, PAST MEDICAL/SURGIAL HISTORY, ALLERGIES, FAMILY AND SOCIAL HISTORY, REVIEW OF SYSTEMS, PHYSICAL EXAM, LABS, Radiology, ASSESSMENT AND PLAN   Last Updated: 24-Jul-13 10:26 by Florene Glen (MD)

## 2014-12-06 NOTE — Op Note (Signed)
PATIENT NAME:  Diane Boyer, Diane Boyer MR#:  [id number] DATE OF BIRTH:  1981-07-18  DATE OF PROCEDURE:  10/13/2012  PREOPERATIVE DIAGNOSES:  Undesired fertility.   POSTOPERATIVE DIAGNOSIS:   Undesired fertility.  OPERATION PERFORMED: Laparoscopic bilateral ligation via Falope rings.   PRIMARY SURGEON:  Lorrene ReidAndreas M Madelein Mahadeo, MD   ASSISTANT:  Cherlyn LabellaAllie Kelly, PA student.   ANESTHESIA: General.   ESTIMATED BLOOD LOSS: Minimal.   OPERATIVE FLUIDS:  1 liter.   URINE OUTPUT: 300 mL of clear urine.   COMPLICATIONS: None.   INTRAOPERATIVE FINDINGS: Normal anatomy with a 1 cm blanched knuckle of tube bilaterally After application of the Falope Rings.   SPECIMENS REMOVED: None.   CONDITION FOLLOWING THE PROCEDURE:  Stable.   PROCEDURE IN DETAIL: Risks, benefits and alternatives of the procedure were discussed with the patient prior to proceeding to the operating room. In addition the permanent nature of the procedure was discussed with the patient. The patient was taken to the operating room where she was placed under general endotracheal anesthesia. She was positioned in the dorsal lithotomy position using Allen stirrups. Timeout was performed. The patient was prepped and draped in the usual sterile fashion. The patient's bladder was emptied using a straight catheter. A sterile speculum was then used to visualize the anterior lip of the cervix which was grasped with a single-tooth tenaculum and a Hulka tenaculum was placed to allow manipulation of the uterus. The single-tooth tenaculum and speculum were then removed.   Attention was turned to the patient's abdomen. The umbilicus was infiltrated with approximately 5 mL of 0.5% Sensorcaine. A stab incision was made in the base of the umbilicus and a 5 mm XL trocar was used to gain entry into the peritoneal cavity under direct visualization. Following placement of the trocar, pneumoperitoneum was begun and the 8 mm suprapubic port was then placed under direct  visualization,.  The above-noted findings were noted. The upper abdomen was also inspected and noted to be normal. The left fallopian tube was grasped with the Falope ring applicator, the tube was retracted into the applicator and the ring was released, noting a good 1 cm knuckle of tube within the ring. This was then repeated on the patient's right fallopian tube in a similar manner in the mid isthmic portion. Following placement of the second Falope ring pneumoperitoneum was evacuated, the ports were removed.  The 8 mm port site was closed with 4-0 Monocryl in a subcuticular fashion. Both port sites were then dressed with Dermabond. The Hulka tenaculum was removed. Sponge, needle and instrument counts were correct x 2. The patient tolerated the procedure well and returned to the recovery room in stable condition.     ____________________________ Florina OuAndreas M. Bonney AidStaebler, MD ams:ct D: 10/13/2012 15:06:27 ET T: 10/14/2012 08:01:06 ET JOB#: [id number]  cc: Florina OuAndreas M. Bonney AidStaebler, MD, <Dictator> Carmel SacramentoANDREAS Cathrine MusterM Cornisha Zetino MD ELECTRONICALLY SIGNED 11/03/2012 9:27

## 2014-12-24 NOTE — H&P (Signed)
L&D Evaluation:  History:   HPI 34 year old G3P1 presents to L&D for IOL due to gestational HTN and GDM at 38 weeks 6 days. EDD 08/15/12, [facility] for early [facility] to care, negative 1st trimester screen, elevated 1 hour GCT, cholecystitis during pregnancy, and gestational HTN. Pt was unable to do 3 hour GTT during pregnancy due to gallbladder upset with finishing the 1 hour (had 2 elevated 1 hour GCT's). Pt was given a meter to test BS for 2 weeks and at that point she was diagnosed GDM given that many of her BS were elevated and she was sent to lifestyles at that time. She has been diet controlled thus far. The past couple weeks she has had gestational HTN but PreE workup has been negative. Today BP in office was 150's/90, NST was reactive and AFI was 6.9 cm (last week it was 13 cm). Labs: A Pos, RI, VI, GBS Negative Desires PP BTL, papers signed 06/02/12    Presents with IOL    Patient's Medical History GDM and gestational HTN    Patient's Surgical History foot surgery    Medications Pre Natal Vitamins    Allergies amoxicillin    Social History none    Family History Non-Contributory   ROS:   ROS All systems were reviewed.  HEENT, CNS, GI, GU, Respiratory, CV, Renal and Musculoskeletal systems were found to be normal.   Exam:   Vital Signs stable    General no apparent distress    Mental Status clear    Chest clear    Abdomen gravid, non-tender    Estimated Fetal Weight Average for gestational age    Edema 1+    Pelvic 3/60/-2    Mebranes Intact    FHT normal rate with no decels    Ucx irregular   Impression:   Impression IOL for GDM and gestational HTN   Plan:   Plan EFM/NST, will start Pitocin for IOL   Electronic Signatures: Shella MaximPutnam, Cayde Held (CNM)  (Signed 23-Dec-13 17:53)  Authored: L&D Evaluation   Last Updated: 23-Dec-13 17:53 by Shella MaximPutnam, Valda Christenson (CNM)

## 2015-02-27 ENCOUNTER — Encounter: Payer: Self-pay | Admitting: Emergency Medicine

## 2015-02-27 ENCOUNTER — Ambulatory Visit
Admission: EM | Admit: 2015-02-27 | Discharge: 2015-02-27 | Disposition: A | Discharge status: Home / Self Care | Payer: BLUE CROSS/BLUE SHIELD | Attending: Internal Medicine | Admitting: Internal Medicine

## 2015-02-27 DIAGNOSIS — F17210 Nicotine dependence, cigarettes, uncomplicated: Secondary | ICD-10-CM | POA: Diagnosis not present

## 2015-02-27 DIAGNOSIS — R110 Nausea: Secondary | ICD-10-CM | POA: Insufficient documentation

## 2015-02-27 DIAGNOSIS — R42 Dizziness and giddiness: Secondary | ICD-10-CM | POA: Diagnosis present

## 2015-02-27 HISTORY — DX: Gestational diabetes mellitus in pregnancy, unspecified control: O24.419

## 2015-02-27 LAB — COMPREHENSIVE METABOLIC PANEL
ALBUMIN: 4.3 g/dL (ref 3.5–5.0)
ALT: 60 U/L — ABNORMAL HIGH (ref 14–54)
ANION GAP: 10 (ref 5–15)
AST: 39 U/L (ref 15–41)
Alkaline Phosphatase: 76 U/L (ref 38–126)
BUN: 13 mg/dL (ref 6–20)
CO2: 24 mmol/L (ref 22–32)
Calcium: 9.1 mg/dL (ref 8.9–10.3)
Chloride: 103 mmol/L (ref 101–111)
Creatinine, Ser: 0.58 mg/dL (ref 0.44–1.00)
GFR calc Af Amer: >60 mL/min (ref >60)
GFR calc non Af Amer: >60 mL/min (ref >60)
GLUCOSE: 97 mg/dL (ref 65–99)
POTASSIUM: 4.1 mmol/L (ref 3.5–5.1)
Sodium: 137 mmol/L (ref 135–145)
Total Bilirubin: 0.6 mg/dL (ref 0.3–1.2)
Total Protein: 8 g/dL (ref 6.5–8.1)

## 2015-02-27 LAB — CBC WITH DIFFERENTIAL/PLATELET
BASOS ABS: 0 10*3/uL (ref 0–0.1)
Basophils Relative: 1 %
Eosinophils Absolute: 0.2 10*3/uL (ref 0–0.7)
Eosinophils Relative: 4 %
HCT: 43.4 % (ref 35.0–47.0)
HEMOGLOBIN: 14.5 g/dL (ref 12.0–16.0)
Lymphocytes Relative: 33 %
Lymphs Abs: 1.7 10*3/uL (ref 1.0–3.6)
MCH: 29.6 pg (ref 26.0–34.0)
MCHC: 33.4 g/dL (ref 32.0–36.0)
MCV: 88.8 fL (ref 80.0–100.0)
MONO ABS: 0.5 10*3/uL (ref 0.2–0.9)
Monocytes Relative: 9 %
NEUTROS PCT: 53 %
Neutro Abs: 2.8 10*3/uL (ref 1.4–6.5)
Platelets: 258 10*3/uL (ref 150–440)
RBC: 4.89 MIL/uL (ref 3.80–5.20)
RDW: 13.2 % (ref 11.5–14.5)
WBC: 5.3 10*3/uL (ref 3.6–11.0)

## 2015-02-27 NOTE — ED Notes (Signed)
Patient c/o nausea and dizziness for the past 2 days.

## 2015-02-27 NOTE — ED Provider Notes (Signed)
CSN: [id number]     Arrival date & time 02/27/15  1012 History   First MD Initiated Contact with Patient 02/27/15 1154     Chief Complaint  Patient presents with  . Nausea  . Dizziness   HPI 33yo lady with 2d hx intermittent/mild nausea, anorexia, feeling off-balance.  Not falling down.  No vomiting.  Sx's worse if up/around.  Little bit of ear ache.  No sick contacts.  No change in chronic headache.  No focal weakness, just malaise.  No change in stress levels/exercise/sleep.  15-20lb weight gain reported over ?last couple months, no change in diet. A little worried about diabetes.  No constipation. No new vitamins/herbs/supplements/meds.  Past Medical History  Diagnosis Date  . Gestational diabetes    Past Surgical History  Procedure Laterality Date  . Foot surgery Right   . Tubal ligation     History reviewed. No pertinent family history. History  Substance Use Topics  . Smoking status: Current Every Day Smoker  . Smokeless tobacco: Never Used  . Alcohol Use: Yes    Review of Systems  Allergies  Penicillins  Home Medications  Not taking any meds regularly    BP 110/88 mmHg  Pulse 86  Temp(Src) 98 F (36.7 C) (Oral)  Resp 16  Ht 5\' 9"  (1.753 m)  Wt 255 lb (115.667 kg)  BMI 37.64 kg/m2  SpO2 98%  LMP 02/13/2015 (Approximate) Physical Exam  Constitutional: She is oriented to person, place, and time. No distress.  Alert, nicely groomed  HENT:  Head: Atraumatic.  B TMs mildly dull, no erythema Mildly congested nasal mucosa Throat slightly red  Eyes:  Conjugate gaze, no eye redness/drainage  Neck: Neck supple.  Cardiovascular: Normal rate and regular rhythm.   Pulmonary/Chest: No respiratory distress. She has no wheezes. She has no rales.  Lungs clear, symmetric breath sounds  Abdominal: She exhibits no distension.  Musculoskeletal: Normal range of motion.  No leg swelling  Neurological: She is alert and oriented to person, place, and time.  Walked into  urgent care independently Face symmetric, speech clear/coherent  Skin: Skin is warm and dry.  No cyanosis  Nursing note and vitals reviewed.   ED Course  Procedures  Results for orders placed or performed during the hospital encounter of 02/27/15  CBC with Differential  Result Value Ref Range   WBC 5.3 3.6 - 11.0 K/uL   RBC 4.89 3.80 - 5.20 MIL/uL   Hemoglobin 14.5 12.0 - 16.0 g/dL   HCT 65.743.4 84.635.0 - 96.247.0 %   MCV 88.8 80.0 - 100.0 fL   MCH 29.6 26.0 - 34.0 pg   MCHC 33.4 32.0 - 36.0 g/dL   RDW 95.213.2 84.111.5 - 32.414.5 %   Platelets 258 150 - 440 K/uL   Neutrophils Relative % 53 %   Neutro Abs 2.8 1.4 - 6.5 K/uL   Lymphocytes Relative 33 %   Lymphs Abs 1.7 1.0 - 3.6 K/uL   Monocytes Relative 9 %   Monocytes Absolute 0.5 0.2 - 0.9 K/uL   Eosinophils Relative 4 %   Eosinophils Absolute 0.2 0 - 0.7 K/uL   Basophils Relative 1 %   Basophils Absolute 0.0 0 - 0.1 K/uL  Comprehensive metabolic panel  Result Value Ref Range   Sodium 137 135 - 145 mmol/L   Potassium 4.1 3.5 - 5.1 mmol/L   Chloride 103 101 - 111 mmol/L   CO2 24 22 - 32 mmol/L   Glucose, Bld 97 65 - 99 mg/dL  BUN 13 6 - 20 mg/dL   Creatinine, Ser 6.57 0.44 - 1.00 mg/dL   Calcium 9.1 8.9 - 84.6 mg/dL   Total Protein 8.0 6.5 - 8.1 g/dL   Albumin 4.3 3.5 - 5.0 g/dL   AST 39 15 - 41 U/L   ALT 60 (H) 14 - 54 U/L   Alkaline Phosphatase 76 38 - 126 U/L   Total Bilirubin 0.6 0.3 - 1.2 mg/dL   GFR calc non Af Amer >60 >60 mL/min   GFR calc Af Amer >60 >60 mL/min   Anion gap 10 5 - 15      MDM   1. Spell of dizziness    Labs, exam, unrevealing.  Push fluids, rest, observe for improvement. If not improving in several days, recheck or FU pcp to discuss further evaluation.    Eustace Moore, MD 02/28/15 [phone]

## 2015-02-27 NOTE — Discharge Instructions (Signed)
Sometimes people have this type of dizziness when they have a virus; if this is true for you, it should pass fairly quickly.  Stress/adrenaline magnifies all symptoms, and may contribute in some way. Followup Dr Yetta BarreJones if not improving in several days, to discuss further evaluation.  Dizziness Dizziness is a common problem. It is a feeling of unsteadiness or light-headedness. You may feel like you are about to faint. Dizziness can lead to injury if you stumble or fall. A person of any age group can suffer from dizziness, but dizziness is more common in older adults. CAUSES  Dizziness can be caused by many different things, including:  Middle ear problems.  Standing for too long.  Infections.  An allergic reaction.  Aging.  An emotional response to something, such as the sight of blood.  Side effects of medicines.  Tiredness.  Problems with circulation or blood pressure.  Excessive use of alcohol or medicines, or illegal drug use.  Breathing too fast (hyperventilation).  An irregular heart rhythm (arrhythmia).  A low red blood cell count (anemia).  Pregnancy.  Vomiting, diarrhea, fever, or other illnesses that cause body fluid loss (dehydration).  Diseases or conditions such as Parkinson's disease, high blood pressure (hypertension), diabetes, and thyroid problems.  Exposure to extreme heat. DIAGNOSIS  Your health care provider will ask about your symptoms, perform a physical exam, and perform an electrocardiogram (ECG) to record the electrical activity of your heart. Your health care provider may also perform other heart or blood tests to determine the cause of your dizziness. These may include:  Transthoracic echocardiogram (TTE). During echocardiography, sound waves are used to evaluate how blood flows through your heart.  Transesophageal echocardiogram (TEE).  Cardiac monitoring. This allows your health care provider to monitor your heart rate and rhythm in real  time.  Holter monitor. This is a portable device that records your heartbeat and can help diagnose heart arrhythmias. It allows your health care provider to track your heart activity for several days if needed.  Stress tests by exercise or by giving medicine that makes the heart beat faster. TREATMENT  Treatment of dizziness depends on the cause of your symptoms and can vary greatly. HOME CARE INSTRUCTIONS   Drink enough fluids to keep your urine clear or pale yellow. This is especially important in very hot weather. In older adults, it is also important in cold weather.  Take your medicine exactly as directed if your dizziness is caused by medicines. When taking blood pressure medicines, it is especially important to get up slowly.  Rise slowly from chairs and steady yourself until you feel okay.  In the morning, first sit up on the side of the bed. When you feel okay, stand slowly while holding onto something until you know your balance is fine.  Move your legs often if you need to stand in one place for a long time. Tighten and relax your muscles in your legs while standing.  Have someone stay with you for 1-2 days if dizziness continues to be a problem. Do this until you feel you are well enough to stay alone. Have the person call your health care provider if he or she notices changes in you that are concerning.  Do not drive or use heavy machinery if you feel dizzy.  Do not drink alcohol. SEEK IMMEDIATE MEDICAL CARE IF:   Your dizziness or light-headedness gets worse.  You feel nauseous or vomit.  You have problems talking, walking, or using your arms, hands,  or legs.  You feel weak.  You are not thinking clearly or you have trouble forming sentences. It may take a friend or family member to notice this.  You have chest pain, abdominal pain, shortness of breath, or sweating.  Your vision changes.  You notice any bleeding.  You have side effects from medicine that seems  to be getting worse rather than better. MAKE SURE YOU:   Understand these instructions.  Will watch your condition.  Will get help right away if you are not doing well or get worse. Document Released: 01/26/2001 Document Revised: 08/07/2013 Document Reviewed: 02/19/2011 [facility] Patient Information 2015 [place], [place]. This information is not intended to replace advice given to you by your health care provider. Make sure you discuss any questions you have with your health care provider.

## 2015-10-14 ENCOUNTER — Ambulatory Visit (INDEPENDENT_AMBULATORY_CARE_PROVIDER_SITE_OTHER): Payer: Managed Care, Other (non HMO) | Admitting: Family Medicine

## 2015-10-14 ENCOUNTER — Encounter: Payer: Self-pay | Admitting: Family Medicine

## 2015-10-14 VITALS — BP 120/80 | HR 120 | Temp 98.0°F | Ht 69.0 in | Wt 248.0 lb

## 2015-10-14 DIAGNOSIS — R509 Fever, unspecified: Secondary | ICD-10-CM

## 2015-10-14 DIAGNOSIS — J0100 Acute maxillary sinusitis, unspecified: Secondary | ICD-10-CM | POA: Diagnosis not present

## 2015-10-14 LAB — POCT INFLUENZA A/B
Influenza A, POC: NEGATIVE
Influenza B, POC: NEGATIVE

## 2015-10-14 MED ORDER — DOXYCYCLINE HYCLATE 100 MG PO TABS
100.0000 mg | ORAL_TABLET | Freq: Two times a day (BID) | ORAL | Status: DC
Start: 2015-10-14 — End: 2016-01-28

## 2015-10-14 NOTE — Progress Notes (Signed)
Name: Diane Boyer   MRN: [id number]    DOB: 1981-04-14   Date:10/14/2015       Progress Note  Subjective  Chief Complaint  Chief Complaint  Patient presents with  . Establish Care  . Sinusitis    headache, chills, facial pressure    Sinusitis This is a new problem. The current episode started 1 to 4 weeks ago. The problem has been waxing and waning since onset. The maximum temperature recorded prior to her arrival was 100.4 - 100.9 F. Her pain is at a severity of 1/10. The pain is mild. Associated symptoms include chills, congestion, ear pain, headaches, neck pain, sinus pressure, sneezing, a sore throat and swollen glands. Pertinent negatives include no coughing, diaphoresis or shortness of breath. Past treatments include acetaminophen and oral decongestants. The treatment provided no relief.    No problem-specific assessment & plan notes found for this encounter.   Past Medical History  Diagnosis Date  . Gestational diabetes     Past Surgical History  Procedure Laterality Date  . Foot surgery Right   . Tubal ligation      Family History  Problem Relation Age of Onset  . Hypertension Mother   . Diabetes Maternal Grandmother   . Heart disease Maternal Grandfather     Social History   Social History  . Marital Status: Single    Spouse Name: N/A  . Number of Children: N/A  . Years of Education: N/A   Occupational History  . Not on file.   Social History Main Topics  . Smoking status: Current Every Day Smoker  . Smokeless tobacco: Never Used  . Alcohol Use: Yes  . Drug Use: No  . Sexual Activity: Yes   Other Topics Concern  . Not on file   Social History Narrative    Allergies  Allergen Reactions  . Penicillins Swelling     Review of Systems  Constitutional: Positive for chills. Negative for fever, weight loss, malaise/fatigue and diaphoresis.  HENT: Positive for congestion, ear pain, sinus pressure, sneezing and sore throat. Negative for ear  discharge.   Eyes: Negative for blurred vision.  Respiratory: Negative for cough, sputum production, shortness of breath and wheezing.   Cardiovascular: Negative for chest pain, palpitations and leg swelling.  Gastrointestinal: Negative for heartburn, nausea, abdominal pain, diarrhea, constipation, blood in stool and melena.  Genitourinary: Negative for dysuria, urgency, frequency and hematuria.  Musculoskeletal: Positive for neck pain. Negative for myalgias, back pain and joint pain.  Skin: Negative for rash.  Neurological: Positive for headaches. Negative for dizziness, tingling, sensory change and focal weakness.  Endo/Heme/Allergies: Negative for environmental allergies and polydipsia. Does not bruise/bleed easily.  Psychiatric/Behavioral: Negative for depression and suicidal ideas. The patient is not nervous/anxious and does not have insomnia.      Objective  Filed Vitals:   10/14/15 1413  BP: 120/80  Pulse: 120  Temp: 98 F (36.7 C)  TempSrc: Oral  Height:  (1.753 m)  Weight: 248 lb (112.492 kg)    Physical Exam  Constitutional: She is well-developed, well-nourished, and in no distress. No distress.  HENT:  Head: Normocephalic and atraumatic.  Right Ear: Tympanic membrane, external ear and ear canal normal.  Left Ear: Tympanic membrane, external ear and ear canal normal.  Nose: Nose normal.  Mouth/Throat: Oropharynx is clear and moist.  Eyes: Conjunctivae and EOM are normal. Pupils are equal, round, and reactive to light. Right eye exhibits no discharge. Left eye exhibits no discharge.  Neck: Normal range of motion. Neck supple. No JVD present. Carotid bruit is not present. Thyromegaly present.  Cardiovascular: Normal rate, regular rhythm, normal heart sounds and intact distal pulses.  Exam reveals no gallop and no friction rub.   No murmur heard. Pulmonary/Chest: Effort normal and breath sounds normal.  Abdominal: Soft. Bowel sounds are normal. She exhibits no  mass. There is no tenderness. There is no guarding.  Musculoskeletal: Normal range of motion. She exhibits no edema.  Lymphadenopathy:    She has no cervical adenopathy.  Neurological: She is alert. She has normal reflexes.  Skin: Skin is warm and dry. She is not diaphoretic.  Psychiatric: Mood and affect normal.  Nursing note and vitals reviewed.     Assessment & Plan  Problem List Items Addressed This Visit    None    Visit Diagnoses    Acute maxillary sinusitis, recurrence not specified    -  Primary    Relevant Medications    doxycycline (VIBRA-TABS) 100 MG tablet    Other Relevant Orders    POCT Influenza A/B (Completed)    Fever and chills        Relevant Orders    POCT Influenza A/B (Completed)         Dr. [name]  10/14/2015

## 2016-01-28 ENCOUNTER — Ambulatory Visit (INDEPENDENT_AMBULATORY_CARE_PROVIDER_SITE_OTHER): Payer: Managed Care, Other (non HMO) | Admitting: Family Medicine

## 2016-01-28 ENCOUNTER — Encounter: Payer: Self-pay | Admitting: Family Medicine

## 2016-01-28 VITALS — BP 120/80 | HR 78 | Ht 69.0 in | Wt 256.0 lb

## 2016-01-28 DIAGNOSIS — E049 Nontoxic goiter, unspecified: Secondary | ICD-10-CM

## 2016-01-28 DIAGNOSIS — E010 Iodine-deficiency related diffuse (endemic) goiter: Secondary | ICD-10-CM

## 2016-01-28 DIAGNOSIS — J0100 Acute maxillary sinusitis, unspecified: Secondary | ICD-10-CM | POA: Diagnosis not present

## 2016-01-28 MED ORDER — AZITHROMYCIN 250 MG PO TABS: ORAL_TABLET | ORAL | Status: DC

## 2016-01-28 NOTE — Progress Notes (Signed)
Name: Diane Boyer   MRN: [id number]    DOB: Nov 29, 1980   Date:01/28/2016       Progress Note  Subjective  Chief Complaint  Chief Complaint  Patient presents with  . Sinusitis    cough and cong- dry cough, drainage    Sinusitis This is a new problem. The current episode started in the past 7 days. The problem has been gradually worsening since onset. The maximum temperature recorded prior to her arrival was 100.4 - 100.9 F. The fever has been present for 3 to 4 days. Her pain is at a severity of 7/10. The pain is moderate. Associated symptoms include congestion, coughing, diaphoresis, headaches, sinus pressure, sneezing and swollen glands. Pertinent negatives include no chills, ear pain, hoarse voice, neck pain, shortness of breath or sore throat. Past treatments include acetaminophen and oral decongestants. The treatment provided mild relief.    No problem-specific assessment & plan notes found for this encounter.   Past Medical History  Diagnosis Date  . Gestational diabetes     Past Surgical History  Procedure Laterality Date  . Foot surgery Right   . Tubal ligation      Family History  Problem Relation Age of Onset  . Hypertension Mother   . Diabetes Maternal Grandmother   . Heart disease Maternal Grandfather     Social History   Social History  . Marital Status: Single    Spouse Name: N/A  . Number of Children: N/A  . Years of Education: N/A   Occupational History  . Not on file.   Social History Main Topics  . Smoking status: Current Every Day Smoker  . Smokeless tobacco: Never Used  . Alcohol Use: Yes  . Drug Use: No  . Sexual Activity: Yes   Other Topics Concern  . Not on file   Social History Narrative    Allergies  Allergen Reactions  . Penicillins Swelling     Review of Systems  Constitutional: Positive for diaphoresis. Negative for fever, chills, weight loss and malaise/fatigue.  HENT: Positive for congestion, sinus pressure and  sneezing. Negative for ear discharge, ear pain, hoarse voice and sore throat.   Eyes: Negative for blurred vision.  Respiratory: Positive for cough. Negative for sputum production, shortness of breath and wheezing.   Cardiovascular: Negative for chest pain, palpitations and leg swelling.  Gastrointestinal: Negative for heartburn, nausea, abdominal pain, diarrhea, constipation, blood in stool and melena.  Genitourinary: Negative for dysuria, urgency, frequency and hematuria.  Musculoskeletal: Negative for myalgias, back pain, joint pain and neck pain.  Skin: Negative for rash.  Neurological: Positive for headaches. Negative for dizziness, tingling, sensory change and focal weakness.  Endo/Heme/Allergies: Negative for environmental allergies and polydipsia. Does not bruise/bleed easily.  Psychiatric/Behavioral: Negative for depression and suicidal ideas. The patient is not nervous/anxious and does not have insomnia.      Objective  Filed Vitals:   01/28/16 1422  BP: 120/80  Pulse: 78  Height:  (1.753 m)  Weight: 256 lb (116.121 kg)    Physical Exam  Constitutional: She is well-developed, well-nourished, and in no distress. No distress.  HENT:  Head: Normocephalic and atraumatic.  Right Ear: External ear normal.  Left Ear: External ear normal.  Nose: Nose normal.  Mouth/Throat: Oropharynx is clear and moist.  Eyes: Conjunctivae and EOM are normal. Pupils are equal, round, and reactive to light. Right eye exhibits no discharge. Left eye exhibits no discharge.  Neck: Trachea normal and normal range of motion.  Neck supple. Normal carotid pulses and no JVD present. Carotid bruit is not present. Thyromegaly present.  Cardiovascular: Normal rate, regular rhythm, normal heart sounds and intact distal pulses.  Exam reveals no gallop and no friction rub.   No murmur heard. Pulmonary/Chest: Effort normal and breath sounds normal.  Abdominal: Soft. Bowel sounds are normal. She exhibits no  mass. There is no tenderness. There is no guarding.  Musculoskeletal: Normal range of motion. She exhibits no edema.  Lymphadenopathy:    She has no cervical adenopathy.  Neurological: She is alert.  Skin: Skin is warm and dry. She is not diaphoretic.  Psychiatric: Mood and affect normal.  Nursing note and vitals reviewed.     Assessment & Plan  Problem List Items Addressed This Visit    None    Visit Diagnoses    Acute maxillary sinusitis, recurrence not specified    -  Primary    Relevant Medications    azithromycin (ZITHROMAX) 250 MG tablet    Thyromegaly        Relevant Orders    TSH         Dr. Hayden Rasmusseneanna [facility]  01/28/2016

## 2016-01-29 LAB — TSH: TSH: 1.78 u[IU]/mL (ref 0.450–4.500)

## 2017-08-04 ENCOUNTER — Encounter: Payer: Self-pay | Admitting: Certified Nurse Midwife

## 2017-08-04 ENCOUNTER — Ambulatory Visit (INDEPENDENT_AMBULATORY_CARE_PROVIDER_SITE_OTHER): Payer: BLUE CROSS/BLUE SHIELD | Admitting: Certified Nurse Midwife

## 2017-08-04 VITALS — BP 130/88 | HR 82 | Ht 70.0 in | Wt 264.0 lb

## 2017-08-04 DIAGNOSIS — Z131 Encounter for screening for diabetes mellitus: Secondary | ICD-10-CM

## 2017-08-04 DIAGNOSIS — E669 Obesity, unspecified: Secondary | ICD-10-CM | POA: Diagnosis not present

## 2017-08-04 DIAGNOSIS — N939 Abnormal uterine and vaginal bleeding, unspecified: Secondary | ICD-10-CM | POA: Diagnosis not present

## 2017-08-04 DIAGNOSIS — Z13220 Encounter for screening for lipoid disorders: Secondary | ICD-10-CM

## 2017-08-04 DIAGNOSIS — O24419 Gestational diabetes mellitus in pregnancy, unspecified control: Secondary | ICD-10-CM | POA: Insufficient documentation

## 2017-08-04 DIAGNOSIS — O139 Gestational [pregnancy-induced] hypertension without significant proteinuria, unspecified trimester: Secondary | ICD-10-CM | POA: Insufficient documentation

## 2017-08-04 DIAGNOSIS — Z01419 Encounter for gynecological examination (general) (routine) without abnormal findings: Secondary | ICD-10-CM | POA: Diagnosis not present

## 2017-08-04 NOTE — Progress Notes (Signed)
Gynecology Annual Exam  PCP: Duanne Limerick, MD  Chief Complaint:  Chief Complaint  Patient presents with  . Gynecologic Exam    History of Present Illness: Diane Boyer is a 36 y.o. [id number] WF who presents for a NP gyn exam with complaints of bleeding every 2 weeks for the past 4 months.  The patient was last seen at [facility] in 2014.  Her menses were regular until September, occurring every month, and lasting 5 days. Since September her menses occur every 2 weeks, last 3-5 days and are sometimes heavier requiring a tampon change every 1-2 hours. Her last menstrual period was about 2 weeks ago. She has dysmenorrhea that lasts for 2 days and is usually relieved with 400-800 mgm ibuprofen. Her cramping is worse since having more frequent menses. Last pap smear: ?2013 during pregnancy, results were normal? Last Pap smear on file with [facility] was 2007 and was NIL. Reports a history of abnormal Pap smears, but denies having a biopsy or cryo.  The patient is sexually active. She currently uses a BTL for contraception. She does not have dyspareunia.  Since her last visit, she has 18#, but has lost 10 of those pounds.   Her past medical history is remarkable for obesity.  The patient does not perform self breast exams. Her last mammogram was NA.  There is no family history of breast cancer.   There is no family history of ovarian cancer.  The patient reports smoking. She smokes <1PPD packs per day. Has been smoking for 20 years. Has quit in the past for pregnancies and a couple other times, usually going cold [place]. Has smoked the e-cigs in the past. Is not ready to stop smoking.  She reports drinking alcohol. She reports have 0-2 drinks per week.  She denies illegal drug use.  The patient does not exercise consistently.  The patient denies current symptoms of depression.    Review of Systems: Review of Systems  Constitutional: Negative for chills, fever and weight loss.     Weight gain  HENT: Negative for congestion, sinus pain and sore throat.   Eyes: Negative for blurred vision and pain.  Respiratory: Negative for hemoptysis, shortness of breath and wheezing.   Cardiovascular: Negative for chest pain, palpitations and leg swelling.  Gastrointestinal: Negative for abdominal pain, blood in stool, diarrhea, heartburn, nausea and vomiting.  Genitourinary: Negative for dysuria, frequency, hematuria and urgency.       Positive for frequent menses and dysmenorrhea.  Musculoskeletal: Negative for back pain, joint pain and myalgias.  Skin: Negative for itching and rash.  Neurological: Negative for dizziness, tingling and headaches.  Endo/Heme/Allergies: Negative for environmental allergies and polydipsia. Does not bruise/bleed easily.       Negative for hirsutism   Psychiatric/Behavioral: Negative for depression. The patient is not nervous/anxious and does not have insomnia.     Past Medical History:  Past Medical History:  Diagnosis Date  . Gestational diabetes 2013  . Gestational hypertension   . Obesity (BMI 35.0-39.9 without comorbidity)   . Shoulder dystocia during labor and delivery, delivered 2013    Past Surgical History:  Past Surgical History:  Procedure Laterality Date  . FOOT SURGERY Right    pin placed in foot for fx  . TUBAL LIGATION      Family History:  Family History  Problem Relation Age of Onset  . Hypertension Mother   . Diabetes Maternal Grandmother   . Heart disease Maternal Grandfather  Social History:  Social History   Socioeconomic History  . Marital status: Single    Spouse name: Not on file  . Number of children: 3  . Years of education: Not on file  . Highest education level: Not on file  Social Needs  . Financial resource strain: Not on file  . Food insecurity - worry: Not on file  . Food insecurity - inability: Not on file  . Transportation needs - medical: Not on file  . Transportation needs -  non-medical: Not on file  Occupational History  . Occupation: Production designer, theatre/television/filmmanager  Tobacco Use  . Smoking status: Current Every Day Smoker    Packs/day: 0.50    Years: 20.00    Pack years: 10.00  . Smokeless tobacco: Never Used  Substance and Sexual Activity  . Alcohol use: Yes    Alcohol/week: 0.0 - 1.2 oz  . Drug use: No  . Sexual activity: Yes    Partners: Male    Birth control/protection: Surgical    Comment: tubal ligation  Other Topics Concern  . Not on file  Social History Narrative  . Not on file    Allergies:  Allergies  Allergen Reactions  . Penicillins Swelling    Medications: Prior to Admission medications   Medication Sig Start Date End Date Taking? Authorizing Provider  cetirizine (ZYRTEC) 10 MG tablet Take 10 mg by mouth as needed for allergies.   Yes [provider]    Physical Exam Vitals: BP 130/88   Pulse 82   Ht 5\' 10"  (1.778 m)   Wt 264 lb (119.7 kg)   LMP 08/04/2017 (Exact Date)   BMI 37.88 kg/m   General: Caucasian female in  NAD HEENT: normocephalic, anicteric Neck: no thyroid enlargement, no palpable nodules, no cervical lymphadenopathy  Pulmonary: No increased work of breathing, CTAB Cardiovascular: RRR, without murmur  Breast: Breast symmetrical, no tenderness, no palpable nodules or masses, no skin or nipple retraction present, no nipple discharge.  No axillary, infraclavicular or supraclavicular lymphadenopathy. Abdomen: Soft, non-tender, obese, non-distended.  Umbilicus without lesions.  No hepatomegaly or masses palpable. No evidence of hernia. Genitourinary:  External: Normal external female genitalia.  Normal urethral meatus, normal Bartholin's and Skene's glands.    Vagina: Normal vaginal mucosa, no evidence of prolapse, small amount of menstrual blood.    Cervix: Grossly normal in appearance, small amount of menstrual bleeding, non-tender  Uterus: Anteverted, normal size, shape, and consistency, mobile, and non-tender  Adnexa: No  adnexal masses, non-tender  Rectal: deferred  Lymphatic: no evidence of inguinal lymphadenopathy Extremities: no edema, erythema, or tenderness Neurologic: Grossly intact Psychiatric: mood appropriate, affect full     Assessment: 36 y.o. [id number] gyn annual exam Menometrorrhagia Obesity History of GDM Tobacco dependence  Plan:   1) Breast cancer screening - recommend monthly self breast exam.   2) Discussed evaluation of abnormal bleeding with ultrasound looking at the endometrial stripe and for problems like polyps and fibroids. Also discussed the probability of using the endometrial biopsy to rule out hyperplasia. Will schedule for ultrasound and follow up. Treatment depends on what the testing reveals. If no pathology found, can try cycling with progestin, using progesterone IUD, Depo etc. Also reviewed surgical treatment of this problem with D&C, ablation, hysterectomy. TSH, CBC today  3) Cervical cancer screening - Unable to do Pap smear today due to bleeding-will try to do at next visit along with STD testing.  4) Contraception -BTL  5) Routine healthcare maintenance including cholesterol and diabetes screening  ordered today   6) Discussed tobacco cessation...she is not ready to stop smoking. Also discussed weight loss efforts-will revisit at FU appointment.  Farrel Connersolleen Aerica Rincon, CNM

## 2017-08-05 LAB — CBC WITH DIFFERENTIAL/PLATELET
Basophils Absolute: 0 10*3/uL (ref 0.0–0.2)
Basos: 0 %
EOS (ABSOLUTE): 0.3 10*3/uL (ref 0.0–0.4)
Eos: 4 %
Hematocrit: 41.5 % (ref 34.0–46.6)
Hemoglobin: 14 g/dL (ref 11.1–15.9)
IMMATURE GRANS (ABS): 0 10*3/uL (ref 0.0–0.1)
IMMATURE GRANULOCYTES: 0 %
LYMPHS: 32 %
Lymphocytes Absolute: 2.4 10*3/uL (ref 0.7–3.1)
MCH: 30.7 pg (ref 26.6–33.0)
MCHC: 33.7 g/dL (ref 31.5–35.7)
MCV: 91 fL (ref 79–97)
MONOCYTES: 7 %
Monocytes Absolute: 0.6 10*3/uL (ref 0.1–0.9)
NEUTROS ABS: 4.4 10*3/uL (ref 1.4–7.0)
NEUTROS PCT: 57 %
PLATELETS: 294 10*3/uL (ref 150–379)
RBC: 4.56 x10E6/uL (ref 3.77–5.28)
RDW: 12.9 % (ref 12.3–15.4)
WBC: 7.7 10*3/uL (ref 3.4–10.8)

## 2017-08-05 LAB — LIPID PANEL WITH LDL/HDL RATIO
CHOLESTEROL TOTAL: 162 mg/dL (ref 100–199)
HDL: 37 mg/dL — AB (ref >39)
LDL Calculated: 88 mg/dL (ref 0–99)
LDl/HDL Ratio: 2.4 ratio (ref 0.0–3.2)
Triglycerides: 186 mg/dL — ABNORMAL HIGH (ref 0–149)
VLDL Cholesterol Cal: 37 mg/dL (ref 5–40)

## 2017-08-05 LAB — HEMOGLOBIN A1C
ESTIMATED AVERAGE GLUCOSE: 120 mg/dL
Hgb A1c MFr Bld: 5.8 % — ABNORMAL HIGH (ref 4.8–5.6)

## 2017-08-05 LAB — TSH: TSH: 2.38 u[IU]/mL (ref 0.450–4.500)

## 2017-08-10 ENCOUNTER — Ambulatory Visit (INDEPENDENT_AMBULATORY_CARE_PROVIDER_SITE_OTHER): Payer: BLUE CROSS/BLUE SHIELD

## 2017-08-10 DIAGNOSIS — N939 Abnormal uterine and vaginal bleeding, unspecified: Secondary | ICD-10-CM | POA: Diagnosis not present

## 2017-08-11 ENCOUNTER — Other Ambulatory Visit: Payer: Self-pay | Admitting: Obstetrics & Gynecology

## 2017-08-11 NOTE — Progress Notes (Signed)
Review of ULTRASOUND.    [facility]    Plan of management to be discussed with patient.  Annamarie MajorPaul Shabnam Ladd, MD, Merlinda FrederickFACOG [facility], [facility] 08/11/2017  12:03 PM

## 2017-08-17 ENCOUNTER — Ambulatory Visit (INDEPENDENT_AMBULATORY_CARE_PROVIDER_SITE_OTHER): Payer: Self-pay | Admitting: Certified Nurse Midwife

## 2017-08-17 ENCOUNTER — Encounter: Payer: Self-pay | Admitting: Certified Nurse Midwife

## 2017-08-17 VITALS — BP 124/82 | HR 87 | Ht 70.0 in | Wt 265.0 lb

## 2017-08-17 DIAGNOSIS — Z124 Encounter for screening for malignant neoplasm of cervix: Secondary | ICD-10-CM

## 2017-08-17 DIAGNOSIS — N939 Abnormal uterine and vaginal bleeding, unspecified: Secondary | ICD-10-CM

## 2017-08-19 LAB — PATHOLOGY

## 2017-08-19 LAB — IGP, APTIMA HPV
HPV Aptima: NEGATIVE
PAP Smear Comment: 0

## 2017-08-19 MED ORDER — NORETHINDRONE ACETATE 5 MG PO TABS: ORAL_TABLET | ORAL | 1 refills | Status: DC

## 2017-08-19 MED ORDER — NORETHINDRONE ACETATE 5 MG PO TABS: 5.0000 mg | ORAL_TABLET | Freq: Every day | ORAL | 11 refills | Status: DC

## 2017-08-19 NOTE — Progress Notes (Signed)
  HPI:   Diane Boyer is a 37 y.o. [id number] WF who presents for a follow up visit after her ultrasound that was ordered after she presented for her annual exam with complaints of bleeding every 2 weeks for the past 4 months. Her menses were regular until September, occurring every month, and lasting 5 days. Since September her menses occur every 2 weeks, last 3-5 days and are sometimes heavier requiring a tampon change every 1-2 hours. Her last menstrual period was about 2 weeks ago (08/04/2017). Today is day 14 of her cycle.  Ultrasound demonstrates normal findings with an endometrial stripe <4 mm.   Also reviewed her lab work: triglycerides (non fasting) was slightly elevated at 186 and HDL a little low at 37. Hemoglobin A1C was also a little elevated at 5.8%. TSH and CBC were both normal.  PMHx: She  has a past medical history of Gestational diabetes (2013), Gestational hypertension, Obesity (BMI 35.0-39.9 without comorbidity), and Shoulder dystocia during labor and delivery, delivered (2013). Also,  has a past surgical history that includes Foot surgery (Right) and Tubal ligation., family history includes Diabetes in her maternal grandmother; Heart disease in her maternal grandfather; Hypertension in her mother.,  reports that she has been smoking.  She has a 10.00 pack-year smoking history. she has never used smokeless tobacco. She reports that she drinks alcohol. She reports that she does not use drugs.  She has a current medication list which includes the following prescription(s): cetirizine. Also, is allergic to penicillins.  ROS  Objective: BP 124/82   Pulse 87   Ht 5\' 10"  (1.778 m)   Wt 120.2 kg (265 lb)   LMP 08/04/2017 (Exact Date)   BMI 38.02 kg/m   Physical examination Constitutional NAD, Conversant  Skin No rashes, lesions or ulceration.   Extremities: Moves all appropriately.  Normal ROM for age. No lymphadenopathy.  Neuro: Grossly intact  Psych: Oriented to PPT.  Normal  mood. Normal affect.  Pelvic: Ext/BUS: no lesions Vagina: no bleeding, no masses Cervix: no lesions, posterior  Assessment: Menometrorrhagia Screening for cervical cancer  Plan: Pap smear done Discussed lab results, decreasing sweets, weight loss and repeating lipid panel and hemoglobin A1C next year when fasting. Recommend endometrial biopsy and patient agrees. See procedure note Farrel Connersolleen Jaden Abreu, CNM   Endometrial Biopsy After discussion with the patient regarding her abnormal uterine bleeding I recommended that she proceed with an endometrial biopsy for further diagnosis. The risks, benefits, alternatives, and indications for an endometrial biopsy were discussed with the patient in detail. She understood the risks including infection, bleeding, cervical laceration and uterine perforation.  Written consent was obtained.   PROCEDURE NOTE:  Pipelle endometrial biopsy was performed using aseptic technique with iodine preparation. Cervix was sprayed with Hurricaine anesthetic. Unable to pass Pipelle initially. Tenaculum applied to anterior lip of cervix after which Pipelle was passed successfully into uterus. The uterus was sounded to a length of 8 cm.  Adequate sampling was obtained with minimal blood loss.  Tenaculum was removed and silver nitrate was applied to the the cervix for hemostasis. The patient tolerated the procedure well.  Tissue sent to pathology. Discussed with patient progestin therapy. RX for norethindrone 5 mgm po daily from day 16-25 of cycle. Will call with results of biopsy.

## 2018-02-20 ENCOUNTER — Encounter: Payer: Self-pay | Admitting: Emergency Medicine

## 2018-02-20 ENCOUNTER — Ambulatory Visit: Payer: Managed Care, Other (non HMO) | Admitting: Family Medicine

## 2018-02-20 ENCOUNTER — Ambulatory Visit
Admission: EM | Admit: 2018-02-20 | Discharge: 2018-02-20 | Disposition: A | Discharge status: Home / Self Care | Payer: BLUE CROSS/BLUE SHIELD | Attending: Family Medicine | Admitting: Family Medicine

## 2018-02-20 DIAGNOSIS — S90869A Insect bite (nonvenomous), unspecified foot, initial encounter: Secondary | ICD-10-CM | POA: Diagnosis not present

## 2018-02-20 DIAGNOSIS — W57XXXA Bitten or stung by nonvenomous insect and other nonvenomous arthropods, initial encounter: Secondary | ICD-10-CM

## 2018-02-20 MED ORDER — DOXYCYCLINE HYCLATE 100 MG PO CAPS: 100.0000 mg | ORAL_CAPSULE | Freq: Two times a day (BID) | ORAL | 0 refills | Status: DC

## 2018-02-20 NOTE — ED Triage Notes (Signed)
Patient stated she was bit by a tick yesterday. She says since yesterday she has been more tired, had a low grade fever and body aches.

## 2018-02-20 NOTE — ED Provider Notes (Signed)
[facility]    CSN: [id number] Arrival date & time: 02/20/18  1146     History   Chief Complaint Chief Complaint  Patient presents with  . Tick Removal    HPI Diane Boyer is a 37 y.o. female.   HPI  37 year old female presents with tick bite that she noticed yesterday.  The tick was attached to her right little toe.  [place] 2 [place] and did not remember being any heavily crest or weed areas.  States that her children in the past had both come down with take fever.  She removed the tick which was mildly engorged.  Since that time she states that she has been more tired felt of have a very fatigued feeling has had low-grade fevers and body aches.  She denies any nausea or vomiting has not noticed any rashes.  She is very concerned of a possible tick bite fever and was wanting to discuss prophylaxis treatment.        Past Medical History:  Diagnosis Date  . Gestational diabetes 2013  . Gestational hypertension   . Obesity (BMI 35.0-39.9 without comorbidity)   . Shoulder dystocia during labor and delivery, delivered 2013    Patient Active Problem List   Diagnosis Date Noted  . Gestational diabetes   . Obesity (BMI 35.0-39.9 without comorbidity)   . Gestational hypertension     Past Surgical History:  Procedure Laterality Date  . FOOT SURGERY Right    pin placed in foot for fx  . TUBAL LIGATION      OB History    Gravida  3   Para  2   Term  2   Preterm      AB  1   Living  2     SAB  1   TAB      Ectopic      Multiple      Live Births  2            Home Medications    Prior to Admission medications   Medication Sig Start Date End Date Taking? Authorizing Provider  cetirizine (ZYRTEC) 10 MG tablet Take 10 mg by mouth as needed for allergies.   Yes [provider]  doxycycline (VIBRAMYCIN) 100 MG capsule Take 1 capsule (100 mg total) by mouth 2 (two) times daily. 02/20/18   Lutricia Feil, PA-C      Family History Family History  Problem Relation Age of Onset  . Hypertension Mother   . Diabetes Maternal Grandmother   . Heart disease Maternal Grandfather     Social History Social History   Tobacco Use  . Smoking status: Former Smoker    Packs/day: 0.50    Years: 20.00    Pack years: 10.00  . Smokeless tobacco: Never Used  Substance Use Topics  . Alcohol use: Yes    Alcohol/week: 0.0 - 1.2 oz  . Drug use: No     Allergies   Penicillins   Review of Systems Review of Systems  Constitutional: Positive for fatigue and fever. Negative for activity change, appetite change and chills.  Skin: Positive for wound.  All other systems reviewed and are negative.    Physical Exam Triage Vital Signs ED Triage Vitals  Enc Vitals Group     BP 02/20/18 1203 (!) 128/91     Pulse Rate 02/20/18 1203 78     Resp 02/20/18 1203 16  Temp 02/20/18 1203 98.1 F (36.7 C)     Temp Source 02/20/18 1203 Oral     SpO2 02/20/18 1203 98 %     Weight 02/20/18 1201 250 lb (113.4 kg)     Height 02/20/18 1201 5\' 9"  (1.753 m)     Head Circumference --      Peak Flow --      Pain Score 02/20/18 1201 4     Pain Loc --      Pain Edu? --      Excl. in GC? --    No data found.  Updated Vital Signs BP (!) 128/91 (BP Location: Left Arm)   Pulse 78   Temp 98.1 F (36.7 C) (Oral)   Resp 16   Ht 5\' 9"  (1.753 m)   Wt 250 lb (113.4 kg)   SpO2 98%   BMI 36.92 kg/m   Visual Acuity Right Eye Distance:   Left Eye Distance:   Bilateral Distance:    Right Eye Near:   Left Eye Near:    Bilateral Near:     Physical Exam  Constitutional: She is oriented to person, place, and time. She appears well-developed and well-nourished. No distress.  HENT:  Head: Normocephalic.  Eyes: Pupils are equal, round, and reactive to light. Right eye exhibits no discharge. Left eye exhibits no discharge.  Neck: Normal range of motion.  Pulmonary/Chest: Effort normal and breath sounds normal.   Musculoskeletal: Normal range of motion.  Neurological: She is alert and oriented to person, place, and time.  Skin: Skin is warm and dry. No rash noted. She is not diaphoretic. There is erythema.  Lamination of the right great toe dorsal lateral aspect erythema but no induration or fluctuance.  There is no drainage from the area.  Psychiatric: She has a normal mood and affect. Her behavior is normal. Judgment and thought content normal.  Nursing note and vitals reviewed.    UC Treatments / Results  Labs (all labs ordered are listed, but only abnormal results are displayed) Labs Reviewed - No data to display  EKG None  Radiology No results found.  Procedures Procedures (including critical care time)  Medications Ordered in UC Medications - No data to display  Initial Impression / Assessment and Plan / UC Course  I have reviewed the triage vital signs and the nursing notes.  Pertinent labs & imaging results that were available during my care of the patient were reviewed by me and considered in my medical decision making (see chart for details).     Plan: 1. Test/x-ray results and diagnosis reviewed with patient 2. rx as per orders; risks, benefits, potential side effects reviewed with patient 3. Recommend supportive treatment with the area of the bite clean and dry as much as possible.  Discussion with her regarding tick bites and the third time the tick must be embedded in order to transmit the spirochete to the host.  She states that she would be more comfortable if she had a prescription that she could take if she develops worsening symptoms or a rash.  I told her this is reasonable and a prescription was provided to her.  If she has any further problems she should follow-up with her primary care physician Dr. Yetta Barre 4. F/u prn if symptoms worsen or don't improve  Final Clinical Impressions(s) / UC Diagnoses   Final diagnoses:  Tick bite of foot, initial encounter    Discharge Instructions   None    ED Prescriptions  Medication Sig Dispense Auth. Provider   doxycycline (VIBRAMYCIN) 100 MG capsule Take 1 capsule (100 mg total) by mouth 2 (two) times daily. 20 capsule Lutricia Feiloemer, Almira Phetteplace P, PA-C     Controlled Substance Prescriptions [place] Controlled Substance Registry consulted? Not Applicable   Lutricia FeilRoemer, Aviannah Castoro P, PA-C 02/20/18 1456

## 2019-03-09 ENCOUNTER — Other Ambulatory Visit: Payer: Self-pay

## 2019-03-09 DIAGNOSIS — Z20822 Contact with and (suspected) exposure to covid-19: Secondary | ICD-10-CM

## 2019-03-12 LAB — NOVEL CORONAVIRUS, NAA: SARS-CoV-2, NAA: NOT DETECTED

## 2021-01-20 ENCOUNTER — Telehealth: Payer: BLUE CROSS/BLUE SHIELD | Admitting: Nurse Practitioner

## 2021-01-20 DIAGNOSIS — R21 Rash and other nonspecific skin eruption: Secondary | ICD-10-CM

## 2021-01-20 DIAGNOSIS — W57XXXA Bitten or stung by nonvenomous insect and other nonvenomous arthropods, initial encounter: Secondary | ICD-10-CM

## 2021-01-20 MED ORDER — DOXYCYCLINE HYCLATE 100 MG PO TABS: 100.0000 mg | ORAL_TABLET | Freq: Two times a day (BID) | ORAL | 0 refills | Status: AC

## 2021-01-20 NOTE — Progress Notes (Signed)
Thank you for describing your tick bite, Here is how we plan to help! Based on the information that you shared with me it looks like you have An infected or complicated tick bite that requires a longer course of antibiotics and will need for you to schedule a follow-up visit with a provider.  In most cases a tick bite is painless and does not itch.  Most tick bites in which the tick is quickly removed do not require prescriptions. Ticks can transmit several diseases if they are infected and remain attacked to your skin. Therefore the length that the tick was attached and any symptoms you have experienced after the bite are import to accurately develop your custom treatment plan. In most cases a single dose of doxycycline may prevent the development of a more serious condition.  Based on your information I have Provided a home care guide for tick bites and  instructions on when to call for help. and Your symptoms indicate that you need a longer course of antibiotics and a follow up visit with a provider. I have sent doxycycline 100 mg twice a day for 21 days to the pharmacy that you selected. You will need to schedule a follow up visit with your provider. If you do not have a primary care provider you may use our telehealth physicians on the web at [facility]  It is also important to stay up to date on your vaccines, since you have not had a tetanus vaccine in the past 10 years you should have that as soon as possible. You can go to a local pharmacy for this without an appointment or prescription.  When picking up your antibiotic today you can request at Tetanus vaccine and that is safe to have the same day you are starting an antibiotic.   Which ticks  are associated with illness?  The Wood Tick (dog tick) is the size of a watermelon seed and can sometimes transmit [facility] spotted fever and [place] tick fever.   The Deer Tick (black-legged tick) is between the size of a poppy seed  (pin head) and an apple seed, and can sometimes transmit Lyme disease.  A brown to black tick with a white splotch on its back is likely a female Amblyomma americanum ([place] tick). This tick has been associated with [place] Tick Associated illness ( STARI)  Lyme disease has become the most common tick-borne illness in the [place]. The risk of Lyme disease following a recognized deer tick bite is estimated to be 1%.  The majority of cases of Lyme disease start with a bull's eye rash at the site of the tick bite. The rash can occur days to weeks (typically 7-10 days) after a tick bite. Treatment with antibiotics is indicated if this rash appears. Flu-like symptoms may accompany the rash, including: fever, chills, headaches, muscle aches, and fatigue. Removing ticks promptly may prevent tick borne disease.  What can be used to prevent Tick Bites?   Insect repellant with at leas 20% DEET.  Wearing long pants with sock and shoes.  Avoiding tall grass and heavily wooded areas.  Checking your skin after being outdoors.  Shower with a washcloth after outdoor exposures.  HOME CARE ADVICE FOR TICK BITE  1. Wood Tick Removal:  o Use a pair of tweezers and grasp the wood tick close to the skin (on its head). Pull the wood tick straight upward without twisting or crushing it. Maintain a steady pressure until it releases its  grip.   o If tweezers aren't available, use fingers, a loop of thread around the jaws, or a needle between the jaws for traction.  o Note: covering the tick with petroleum jelly, nail polish or rubbing alcohol doesn't work. Neither does touching the tick with a hot or cold object. 2. Tiny Deer Tick Removal:   o Needs to be scraped off with a knife blade or credit card edge. o Place tick in a sealed container (e.g. glass jar, zip lock plastic bag), in case your doctor wants to see it. 3. Tick's Head Removal:  o If the wood tick's head breaks off in the skin, it must be  removed. Clean the skin. Then use a sterile needle to uncover the head and lift it out or scrape it off.  o If a very small piece of the head remains, the skin will eventually slough it off. 4. Antibiotic Ointment:  o Wash the wound and your hands with soap and water after removal to prevent catching any tick disease.  Apply an over the counter antibiotic ointment (e.g. bacitracin) to the bite once. 5. Expected Course: Tick bites normally don't itch or hurt. That's why they often go unnoticed. 6. Call Your Doctor If:  o You can't remove the tick or the tick's head o Fever, a severe head ache, or rash occur in the next 2 weeks o Bite begins to look infected o Lyme's disease is common in your area o You have not had a tetanus in the last 10 years o Your current symptoms become worse    MAKE SURE YOU   Understand these instructions.  Will watch your condition.  Will get help right away if you are not doing well or get worse.   Thank you for choosing an e-visit.  Your e-visit answers were reviewed by a board certified advanced clinical practitioner to complete your personal care plan. Depending upon the condition, your plan could have included both over the counter or prescription medications. Please review your pharmacy choice. If there is a problem you may use MyChart messaging to have the prescription routed to another pharmacy. Your safety is important to [place]. If you have drug allergies check your prescription carefully.   You can use MyChart to ask questions about today's visit, request a non-urgent call back, or ask for a work or school excuse for 24 hours related to this e-Visit. If it has been greater than 24 hours you will need to follow up with your provider, or enter a new e-Visit to address those concerns.  You will get an email in the next two days asking about your experience. I hope  that your e-visit has been valuable and will speed your recovery  I spent approximately 10  minutes reviewing the patient's history and coordinating her care   Meds ordered this encounter  Medications  . doxycycline (VIBRA-TABS) 100 MG tablet    Sig: Take 1 tablet (100 mg total) by mouth 2 (two) times daily for 21 days.    Dispense:  42 tablet    Refill:  0

## 2021-02-07 ENCOUNTER — Other Ambulatory Visit: Payer: Self-pay | Admitting: Nurse Practitioner

## 2021-02-07 DIAGNOSIS — W57XXXA Bitten or stung by nonvenomous insect and other nonvenomous arthropods, initial encounter: Secondary | ICD-10-CM

## 2023-08-17 ENCOUNTER — Ambulatory Visit
Admission: EM | Admit: 2023-08-17 | Discharge: 2023-08-17 | Disposition: A | Discharge status: Home / Self Care | Payer: 59 | Attending: Emergency Medicine | Admitting: Emergency Medicine

## 2023-08-17 DIAGNOSIS — J069 Acute upper respiratory infection, unspecified: Secondary | ICD-10-CM | POA: Diagnosis not present

## 2023-08-17 MED ORDER — PROMETHAZINE-DM 6.25-15 MG/5ML PO SYRP: 5.0000 mL | ORAL_SOLUTION | Freq: Four times a day (QID) | ORAL | 0 refills | Status: AC | PRN

## 2023-08-17 MED ORDER — BENZONATATE 100 MG PO CAPS: 200.0000 mg | ORAL_CAPSULE | Freq: Three times a day (TID) | ORAL | 0 refills | Status: AC

## 2023-08-17 MED ORDER — IPRATROPIUM BROMIDE 0.06 % NA SOLN: 2.0000 | Freq: Four times a day (QID) | NASAL | 12 refills | Status: AC

## 2023-08-17 MED ORDER — DOXYCYCLINE HYCLATE 100 MG PO CAPS: 100.0000 mg | ORAL_CAPSULE | Freq: Two times a day (BID) | ORAL | 0 refills | Status: AC

## 2023-08-17 NOTE — ED Triage Notes (Signed)
 Pt c/o cough, nasal congestion, facial pressure, headaches x1week

## 2023-08-17 NOTE — ED Provider Notes (Signed)
 [facility]    CSN: [id number] Arrival date & time: 08/17/23  0905      History   Chief Complaint Chief Complaint  Patient presents with   Cough   Nasal Congestion    HPI Diane Boyer is a 43 y.o. female.   HPI  43 year old female with a past medical history significant for gestational diabetes, gestational hypertension, and obesity presents for evaluation of respiratory symptoms which began 1 week ago which consist of frontal headache, nasal congestion with facial pressure, green nasal discharge, and a nonproductive cough.  She is also endorsing pain in both of her ears and a mild sore throat.  She denies any shortness of breath or wheezing.  Past Medical History:  Diagnosis Date   Gestational diabetes 2013   Gestational hypertension    Obesity (BMI 35.0-39.9 without comorbidity)    Shoulder dystocia during labor and delivery, delivered 2013    Patient Active Problem List   Diagnosis Date Noted   Gestational diabetes    Obesity (BMI 35.0-39.9 without comorbidity)    Gestational hypertension     Past Surgical History:  Procedure Laterality Date   FOOT SURGERY Right    pin placed in foot for fx   TUBAL LIGATION      OB History     Gravida  3   Para  2   Term  2   Preterm      AB  1   Living  2      SAB  1   IAB      Ectopic      Multiple      Live Births  2            Home Medications    Prior to Admission medications   Medication Sig Start Date End Date Taking? Authorizing Provider  benzonatate  (TESSALON ) 100 MG capsule Take 2 capsules (200 mg total) by mouth every 8 (eight) hours. 08/17/23  Yes Bernardino Ditch, NP  cetirizine (ZYRTEC) 10 MG tablet Take 10 mg by mouth as needed for allergies.   Yes [provider]  doxycycline  (VIBRAMYCIN ) 100 MG capsule Take 1 capsule (100 mg total) by mouth 2 (two) times daily for 7 days. 08/17/23 08/24/23 Yes Bernardino Ditch, NP  ipratropium (ATROVENT ) 0.06 % nasal spray Place 2 sprays  into both nostrils 4 (four) times daily. 08/17/23  Yes Bernardino Ditch, NP  promethazine -dextromethorphan (PROMETHAZINE -DM) 6.25-15 MG/5ML syrup Take 5 mLs by mouth 4 (four) times daily as needed. 08/17/23  Yes Bernardino Ditch, NP    Family History Family History  Problem Relation Age of Onset   Hypertension Mother    Diabetes Maternal Grandmother    Heart disease Maternal Grandfather     Social History Social History   Tobacco Use   Smoking status: Former    Current packs/day: 0.50    Average packs/day: 0.5 packs/day for 20.0 years (10.0 ttl pk-yrs)    Types: Cigarettes   Smokeless tobacco: Never  Vaping Use   Vaping status: Never Used  Substance Use Topics   Alcohol use: Yes    Alcohol/week: 0.0 - 2.0 standard drinks of alcohol   Drug use: No     Allergies   Amoxicillin and Penicillins   Review of Systems Review of Systems  Constitutional:  Negative for fever.  HENT:  Positive for congestion, ear pain, rhinorrhea and sore throat.   Respiratory:  Positive for cough. Negative for shortness of breath and wheezing.  Physical Exam Triage Vital Signs ED Triage Vitals  Encounter Vitals Group     BP 08/17/23 0951 135/88     Systolic BP Percentile --      Diastolic BP Percentile --      Pulse Rate 08/17/23 0951 74     Resp --      Temp 08/17/23 0951 (!) 97.5 F (36.4 C)     Temp Source 08/17/23 0951 Oral     SpO2 08/17/23 0951 100 %     Weight 08/17/23 0950 240 lb (108.9 kg)     Height 08/17/23 0950 5' 10 (1.778 m)     Head Circumference --      Peak Flow --      Pain Score 08/17/23 0950 6     Pain Loc --      Pain Education --      Exclude from Growth Chart --    No data found.  Updated Vital Signs BP 135/88 (BP Location: Left Arm)   Pulse 74   Temp (!) 97.5 F (36.4 C) (Oral)   Ht 5' 10 (1.778 m)   Wt 240 lb (108.9 kg)   LMP 07/27/2023   SpO2 100%   BMI 34.44 kg/m   Visual Acuity Right Eye Distance:   Left Eye Distance:   Bilateral Distance:     Right Eye Near:   Left Eye Near:    Bilateral Near:     Physical Exam Vitals and nursing note reviewed.  Constitutional:      Appearance: Normal appearance. She is ill-appearing.  HENT:     Head: Normocephalic and atraumatic.     Right Ear: Tympanic membrane, ear canal and external ear normal. There is no impacted cerumen.     Left Ear: Tympanic membrane, ear canal and external ear normal. There is no impacted cerumen.     Nose: Congestion and rhinorrhea present.     Comments: Nasal mucosa is erythematous and edematous with green discharge in both nares.    Mouth/Throat:     Mouth: Mucous membranes are moist.     Pharynx: Oropharynx is clear. Posterior oropharyngeal erythema present. No oropharyngeal exudate.     Comments: Mild erythema to the posterior oropharynx. Cardiovascular:     Rate and Rhythm: Normal rate and regular rhythm.     Pulses: Normal pulses.     Heart sounds: Normal heart sounds. No murmur heard.    No friction rub. No gallop.  Pulmonary:     Effort: Pulmonary effort is normal.     Breath sounds: Normal breath sounds. No wheezing, rhonchi or rales.  Musculoskeletal:     Cervical back: Normal range of motion and neck supple. No tenderness.  Lymphadenopathy:     Cervical: No cervical adenopathy.  Skin:    General: Skin is warm and dry.     Capillary Refill: Capillary refill takes less than 2 seconds.     Findings: No rash.  Neurological:     General: No focal deficit present.     Mental Status: She is alert and oriented to person, place, and time.      UC Treatments / Results  Labs (all labs ordered are listed, but only abnormal results are displayed) Labs Reviewed - No data to display  EKG   Radiology No results found.  Procedures Procedures (including critical care time)  Medications Ordered in UC Medications - No data to display  Initial Impression / Assessment and Plan / UC Course  I have  reviewed the triage vital signs and the  nursing notes.  Pertinent labs & imaging results that were available during my care of the patient were reviewed by me and considered in my medical decision making (see chart for details).   Patient is a pleasant, though mildly ill-appearing 43 year old female presenting for evaluation of 1 week worth of respiratory symptoms as outlined HPI above.  Her physical exam does reveal inflamed nasal mucosa with green nasal discharge in both nares.  She has mild tenderness to compression of her maxillary sinuses but not her frontal.  Oropharyngeal exam does reveal mild erythema to the posterior oropharynx without any appreciable postnasal drip.  Tonsillar pillars are unremarkable.  No cervical lymphadenopathy appreciated on exam.  Cardiopulmonary exam reveals clear lung sounds in all fields.  Patient exam is consistent with an upper respiratory infection and given that she has had symptoms for a week I feel a trial of antibiotics is warranted.  She has an allergy to amoxicillin and penicillins and is not sure if she is ever taken a cephalosporin.  Therefore, I will discharge her home on doxycycline  100 mg twice daily for 7 days for treatment of her URI.  Tessalon  Perles and Promethazine  DM cough syrup for cough and congestion.  Atrovent  nasal spray to help with nasal congestion.  Return precautions reviewed.  She denies need for work note.   Final Clinical Impressions(s) / UC Diagnoses   Final diagnoses:  URI with cough and congestion     Discharge Instructions      The doxycycline  twice daily with food for 7 days for treatment of your URI.  Perform sinus irrigation 2-3 times a day with a [employer] sinus rinse kit and distilled water.  Do not use tap water.  You can use plain over-the-counter Mucinex every 6 hours to break up the stickiness of the mucus so your body can clear it.  Increase your oral fluid intake to thin out your mucus so that is also able for your body to clear more easily.  Take an  over-the-counter probiotic, such as Culturelle-align-activia, 1 hour after each dose of antibiotic to prevent diarrhea.  Use the Atrovent  nasal spray, 2 squirts in each nostril every 6 hours, as needed for runny nose and postnasal drip.  Use the Tessalon  Perles every 8 hours during the day.  Take them with a small sip of water.  They may give you some numbness to the base of your tongue or a metallic taste in your mouth, this is normal.  Use the Promethazine  DM cough syrup at bedtime for cough and congestion.  It will make you drowsy so do not take it during the day.  Return for reevaluation or see your primary care provider for any new or worsening symptoms.      ED Prescriptions     Medication Sig Dispense Auth. Provider   benzonatate  (TESSALON ) 100 MG capsule Take 2 capsules (200 mg total) by mouth every 8 (eight) hours. 21 capsule Bernardino Ditch, NP   doxycycline  (VIBRAMYCIN ) 100 MG capsule Take 1 capsule (100 mg total) by mouth 2 (two) times daily for 7 days. 14 capsule Bernardino Ditch, NP   ipratropium (ATROVENT ) 0.06 % nasal spray Place 2 sprays into both nostrils 4 (four) times daily. 15 mL Bernardino Ditch, NP   promethazine -dextromethorphan (PROMETHAZINE -DM) 6.25-15 MG/5ML syrup Take 5 mLs by mouth 4 (four) times daily as needed. 118 mL Bernardino Ditch, NP      PDMP not reviewed this encounter.  Bernardino Ditch, NP 08/17/23 1002

## 2023-08-17 NOTE — Discharge Instructions (Addendum)
 The doxycycline  twice daily with food for 7 days for treatment of your URI.  Perform sinus irrigation 2-3 times a day with a [employer] sinus rinse kit and distilled water.  Do not use tap water.  You can use plain over-the-counter Mucinex every 6 hours to break up the stickiness of the mucus so your body can clear it.  Increase your oral fluid intake to thin out your mucus so that is also able for your body to clear more easily.  Take an over-the-counter probiotic, such as Culturelle-align-activia, 1 hour after each dose of antibiotic to prevent diarrhea.  Use the Atrovent  nasal spray, 2 squirts in each nostril every 6 hours, as needed for runny nose and postnasal drip.  Use the Tessalon  Perles every 8 hours during the day.  Take them with a small sip of water.  They may give you some numbness to the base of your tongue or a metallic taste in your mouth, this is normal.  Use the Promethazine  DM cough syrup at bedtime for cough and congestion.  It will make you drowsy so do not take it during the day.  Return for reevaluation or see your primary care provider for any new or worsening symptoms.

## 2023-12-20 ENCOUNTER — Ambulatory Visit
Admission: EM | Admit: 2023-12-20 | Discharge: 2023-12-20 | Discharge status: Against Medical Advice | Attending: Emergency Medicine | Admitting: Emergency Medicine

## 2023-12-20 NOTE — ED Triage Notes (Signed)
 Pt presents to UC d/t elevated BP x1 day. Highest being 153/111 today c/o HA & dizziness.
